# Patient Record
Sex: Female | Born: 1965 | Hispanic: Yes | Marital: Married | Smoking: Never smoker
Health system: Southern US, Community
[De-identification: ages and names within clinical notes are randomized; demographics above are authoritative.]

## PROBLEM LIST (undated history)

## (undated) DIAGNOSIS — E079 Disorder of thyroid, unspecified: Secondary | ICD-10-CM

## (undated) HISTORY — PX: EYE SURGERY: SHX253

## (undated) HISTORY — PX: ABDOMINAL HYSTERECTOMY: SHX81

---

## 2001-05-21 HISTORY — PX: ABDOMINAL HYSTERECTOMY: SHX81

## 2003-05-22 HISTORY — PX: OTHER SURGICAL HISTORY: SHX169

## 2003-05-22 HISTORY — PX: OPTIC NERVE DECOMPRESSION: SHX365

## 2003-10-03 ENCOUNTER — Emergency Department (HOSPITAL_COMMUNITY): Admission: EM | Admit: 2003-10-03 | Discharge: 2003-10-04 | Payer: Self-pay | Admitting: Emergency Medicine

## 2004-01-21 ENCOUNTER — Ambulatory Visit (HOSPITAL_COMMUNITY): Admission: RE | Admit: 2004-01-21 | Discharge: 2004-01-21 | Payer: Self-pay

## 2004-03-01 ENCOUNTER — Ambulatory Visit (HOSPITAL_COMMUNITY): Admission: RE | Admit: 2004-03-01 | Discharge: 2004-03-01 | Payer: Self-pay

## 2004-04-27 ENCOUNTER — Emergency Department (HOSPITAL_COMMUNITY): Admission: EM | Admit: 2004-04-27 | Discharge: 2004-04-27 | Payer: Self-pay | Admitting: Emergency Medicine

## 2004-05-21 HISTORY — PX: STRABISMUS SURGERY: SHX218

## 2004-12-15 ENCOUNTER — Encounter: Admission: RE | Admit: 2004-12-15 | Discharge: 2004-12-15 | Payer: Self-pay | Admitting: Obstetrics and Gynecology

## 2005-05-17 ENCOUNTER — Encounter: Admission: RE | Admit: 2005-05-17 | Discharge: 2005-05-17 | Payer: Self-pay | Admitting: Family Medicine

## 2005-12-17 ENCOUNTER — Encounter: Admission: RE | Admit: 2005-12-17 | Discharge: 2005-12-17 | Payer: Self-pay | Admitting: Obstetrics and Gynecology

## 2006-04-28 ENCOUNTER — Emergency Department (HOSPITAL_COMMUNITY): Admission: EM | Admit: 2006-04-28 | Discharge: 2006-04-28 | Payer: Self-pay | Admitting: Family Medicine

## 2006-07-06 ENCOUNTER — Emergency Department (HOSPITAL_COMMUNITY): Admission: EM | Admit: 2006-07-06 | Discharge: 2006-07-06 | Payer: Self-pay | Admitting: Family Medicine

## 2006-08-15 ENCOUNTER — Emergency Department (HOSPITAL_COMMUNITY): Admission: EM | Admit: 2006-08-15 | Discharge: 2006-08-15 | Payer: Self-pay | Admitting: Emergency Medicine

## 2006-09-28 ENCOUNTER — Emergency Department (HOSPITAL_COMMUNITY): Admission: EM | Admit: 2006-09-28 | Discharge: 2006-09-28 | Payer: Self-pay | Admitting: Emergency Medicine

## 2006-12-15 ENCOUNTER — Emergency Department (HOSPITAL_COMMUNITY): Admission: EM | Admit: 2006-12-15 | Discharge: 2006-12-15 | Payer: Self-pay | Admitting: Family Medicine

## 2006-12-19 ENCOUNTER — Encounter: Admission: RE | Admit: 2006-12-19 | Discharge: 2006-12-19 | Payer: Self-pay | Admitting: Family Medicine

## 2007-01-02 ENCOUNTER — Emergency Department (HOSPITAL_COMMUNITY): Admission: EM | Admit: 2007-01-02 | Discharge: 2007-01-02 | Payer: Self-pay | Admitting: Emergency Medicine

## 2007-10-08 ENCOUNTER — Emergency Department (HOSPITAL_COMMUNITY): Admission: EM | Admit: 2007-10-08 | Discharge: 2007-10-08 | Payer: Self-pay | Admitting: Emergency Medicine

## 2008-01-09 ENCOUNTER — Emergency Department (HOSPITAL_COMMUNITY): Admission: EM | Admit: 2008-01-09 | Discharge: 2008-01-09 | Payer: Self-pay | Admitting: Emergency Medicine

## 2008-02-07 ENCOUNTER — Emergency Department (HOSPITAL_COMMUNITY): Admission: EM | Admit: 2008-02-07 | Discharge: 2008-02-07 | Payer: Self-pay | Admitting: Family Medicine

## 2008-09-05 ENCOUNTER — Emergency Department (HOSPITAL_COMMUNITY): Admission: EM | Admit: 2008-09-05 | Discharge: 2008-09-05 | Payer: Self-pay | Admitting: Emergency Medicine

## 2009-06-02 ENCOUNTER — Emergency Department (HOSPITAL_COMMUNITY): Admission: EM | Admit: 2009-06-02 | Discharge: 2009-06-02 | Payer: Self-pay | Admitting: Family Medicine

## 2010-04-12 ENCOUNTER — Ambulatory Visit: Payer: Self-pay | Admitting: Family Medicine

## 2010-04-12 DIAGNOSIS — J0100 Acute maxillary sinusitis, unspecified: Secondary | ICD-10-CM | POA: Insufficient documentation

## 2010-04-12 DIAGNOSIS — E039 Hypothyroidism, unspecified: Secondary | ICD-10-CM | POA: Insufficient documentation

## 2010-04-12 DIAGNOSIS — J069 Acute upper respiratory infection, unspecified: Secondary | ICD-10-CM | POA: Insufficient documentation

## 2010-04-15 ENCOUNTER — Telehealth (INDEPENDENT_AMBULATORY_CARE_PROVIDER_SITE_OTHER): Payer: Self-pay

## 2010-05-21 HISTORY — PX: BUNIONECTOMY: SHX129

## 2010-06-11 ENCOUNTER — Encounter: Payer: Self-pay | Admitting: Ophthalmology

## 2010-06-20 NOTE — Progress Notes (Signed)
Summary: Courtesy call  Phone Note Outgoing Call   Call placed by: Areta Haber CMA,  April 15, 2010 12:28 PM Call placed to: Patient Summary of Call: Courtesy call to pt - Pt states she's feeling much better. Initial call taken by: Areta Haber CMA,  April 15, 2010 12:29 PM

## 2010-06-20 NOTE — Assessment & Plan Note (Signed)
Summary: CONGESTION,COUGH,HEADACHE/TJ (rm 4)   Vital Signs:  Patient Profile:   45 Years Old Female CC:      dry cough, sinus pressure, HA x 1 week Height:     55 inches Weight:      179 pounds O2 Sat:      98 % O2 treatment:    Room Air Temp:     97.5 degrees F oral Pulse rate:   62 / minute Resp:     14 per minute BP sitting:   105 / 69  (left arm) Cuff size:   regular  Pt. in pain?   yes    Location:   head    Intensity:   4    Type:       aching  Vitals Entered By: Lajean Saver RN (April 12, 2010 4:51 PM)                   Updated Prior Medication List: SYNTHROID 100 MCG TABS (LEVOTHYROXINE SODIUM)  ESTRADIOL 2 MG TABS (ESTRADIOL)  ALKA-SELTZER PLUS COLD 2-7.8-325 MG TBEF (CHLORPHEN-PHENYLEPH-ASA)   Current Allergies: No known allergies History of Present Illness Chief Complaint: dry cough, sinus pressure, HA x 1 week History of Present Illness:  Subjective: Patient complains of URI symptoms that started one week ago with a sore throat and nasal congestion, followed by a cough.  The cough is worse at night.   No pleuritic pain No wheezing + post-nasal drainage + left sinus pain/pressure for 2 days No itchy/red eyes + left earache No hemoptysis No SOB No fever; + chills started yesterday No nausea No vomiting No abdominal pain No diarrhea No skin rashes + fatigue No myalgias + headache Used OTC meds without relief   REVIEW OF SYSTEMS Constitutional Symptoms       Complains of fatigue.     Denies fever, chills, night sweats, weight loss, and weight gain.  Eyes       Complains of eye pain.      Denies change in vision, eye discharge, glasses, contact lenses, and eye surgery.      Comments: eye swelling in AM Ear/Nose/Throat/Mouth       Complains of sinus problems.      Denies hearing loss/aids, change in hearing, ear pain, ear discharge, dizziness, frequent runny nose, frequent nose bleeds, sore throat, hoarseness, and tooth pain or bleeding.    Respiratory       Complains of dry cough.      Denies productive cough, wheezing, shortness of breath, asthma, bronchitis, and emphysema/COPD.  Cardiovascular       Denies murmurs, chest pain, and tires easily with exhertion.    Gastrointestinal       Denies stomach pain, nausea/vomiting, diarrhea, constipation, blood in bowel movements, and indigestion. Genitourniary       Denies painful urination, kidney stones, and loss of urinary control. Neurological       Complains of headaches.      Denies paralysis, seizures, and fainting/blackouts. Musculoskeletal       Denies muscle pain, joint pain, joint stiffness, decreased range of motion, redness, swelling, muscle weakness, and gout.  Skin       Denies bruising, unusual mles/lumps or sores, and hair/skin or nail changes.  Psych       Denies mood changes, temper/anger issues, anxiety/stress, speech problems, depression, and sleep problems. Other Comments: Patient has had symptoms x 1 week. She has taken Alkaseltzer OTC without relief. No fever   Past History:  Past Medical History: Hypothyroidism  Past Surgical History: Hysterectomy  Family History: NA  Social History: Married Never Smoked Alcohol use-no Drug use-no Smoking Status:  never Drug Use:  no   Objective:  Appearance:  Patient appears healthy, stated age, and in no acute distress  Eyes:  Pupils are equal, round, and reactive to light and accomdation.  Extraocular movement is intact.  Conjunctivae are not inflamed.  Ears:  Canals normal.  Tympanic membranes normal.   Nose:  Normal septum.  Normal turbinates, mildly congested.  Maxillary and frontal sinus tenderness present.  Pharynx:  Normal  Neck:  Supple.  No adenopathy is present.  No thyromegaly is present  Lungs:  Clear to auscultation.  Breath sounds are equal.  Heart:  Regular rate and rhythm without murmurs, rubs, or gallops.  Abdomen:  Nontender without masses or hepatosplenomegaly.  Bowel sounds are  present.  No CVA or flank tenderness.  Extremities:  No edema.   Assessment New Problems: ACUTE MAXILLARY SINUSITIS (ICD-461.0) UPPER RESPIRATORY INFECTION (ICD-465.9) HYPOTHYROIDISM (ICD-244.9)   Plan New Medications/Changes: BENZONATATE 200 MG CAPS (BENZONATATE) One by mouth hs as needed cough  #12 x 0, 04/12/2010, Donna Christen MD AZITHROMYCIN 250 MG TABS (AZITHROMYCIN) Two tabs by mouth on day 1, then 1 tab daily on days 2 through 5  #6 tabs x 0, 04/12/2010, Donna Christen MD  New Orders: New Patient Level III [phone] Planning Comments:   Begin Z-pack, expectorant/decongestant, cough suppressant at bedtime.  Increase fluid intake Followup with PCP if not improving one week.   The patient and/or caregiver has been counseled thoroughly with regard to medications prescribed including dosage, schedule, interactions, rationale for use, and possible side effects and they verbalize understanding.  Diagnoses and expected course of recovery discussed and will return if not improved as expected or if the condition worsens. Patient and/or caregiver verbalized understanding.  Prescriptions: BENZONATATE 200 MG CAPS (BENZONATATE) One by mouth hs as needed cough  #12 x 0   Entered and Authorized by:   Donna Christen MD   Signed by:   Donna Christen MD on 04/12/2010   Method used:   Print then Give to Patient   RxID:   [id number] AZITHROMYCIN 250 MG TABS (AZITHROMYCIN) Two tabs by mouth on day 1, then 1 tab daily on days 2 through 5  #6 tabs x 0   Entered and Authorized by:   Donna Christen MD   Signed by:   Donna Christen MD on 04/12/2010   Method used:   Print then Give to Patient   RxID:   [id number]   Patient Instructions: 1)  Take Mucinex D (guaifenesin with decongestant) twice daily for congestion. 2)  Increase fluid intake, rest. 3)  May use Afrin nasal spray (or generic oxymetazoline) twice daily for about 5 days.  Also recommend using saline nasal spray several times daily  and/or saline nasal irrigation. 4)  Followup with family doctor if not improving one week.   Orders Added: 1)  New Patient Level III [29528]

## 2010-08-06 LAB — POCT URINALYSIS DIP (DEVICE)
Bilirubin Urine: NEGATIVE
Glucose, UA: NEGATIVE mg/dL
Ketones, ur: NEGATIVE mg/dL
Nitrite: POSITIVE — AB
Protein, ur: 30 mg/dL — AB
Specific Gravity, Urine: 1.02 (ref 1.005–1.030)
Urobilinogen, UA: 0.2 mg/dL (ref 0.0–1.0)
pH: 7 (ref 5.0–8.0)

## 2010-10-02 ENCOUNTER — Other Ambulatory Visit: Payer: Self-pay | Admitting: Obstetrics & Gynecology

## 2010-10-02 DIAGNOSIS — Z1231 Encounter for screening mammogram for malignant neoplasm of breast: Secondary | ICD-10-CM

## 2010-10-06 ENCOUNTER — Ambulatory Visit
Admission: RE | Admit: 2010-10-06 | Discharge: 2010-10-06 | Disposition: A | Discharge status: Home / Self Care | Payer: BC Managed Care – PPO | Source: Ambulatory Visit | Attending: Obstetrics & Gynecology | Admitting: Obstetrics & Gynecology

## 2010-10-06 DIAGNOSIS — Z1231 Encounter for screening mammogram for malignant neoplasm of breast: Secondary | ICD-10-CM

## 2010-12-22 ENCOUNTER — Other Ambulatory Visit: Payer: Self-pay | Admitting: Internal Medicine

## 2010-12-22 DIAGNOSIS — Z1231 Encounter for screening mammogram for malignant neoplasm of breast: Secondary | ICD-10-CM

## 2010-12-28 ENCOUNTER — Ambulatory Visit: Payer: BC Managed Care – PPO

## 2011-02-19 LAB — POCT RAPID STREP A: Streptococcus, Group A Screen (Direct): NEGATIVE

## 2011-07-01 ENCOUNTER — Emergency Department
Admission: EM | Admit: 2011-07-01 | Discharge: 2011-07-01 | Disposition: A | Discharge status: Home / Self Care | Payer: Self-pay | Source: Home / Self Care | Attending: Emergency Medicine | Admitting: Emergency Medicine

## 2011-07-01 DIAGNOSIS — J069 Acute upper respiratory infection, unspecified: Secondary | ICD-10-CM

## 2011-07-01 HISTORY — DX: Disorder of thyroid, unspecified: E07.9

## 2011-07-01 MED ORDER — AMOXICILLIN 875 MG PO TABS: 875.0000 mg | ORAL_TABLET | Freq: Two times a day (BID) | ORAL | Status: AC

## 2011-07-01 NOTE — ED Provider Notes (Signed)
History     CSN: [id number]  Arrival date & time 07/01/11  1116   First MD Initiated Contact with Patient 07/01/11 1122      No chief complaint on file.   (Consider location/radiation/quality/duration/timing/severity/associated sxs/prior treatment) HPI Tara Preston is a 46 y.o. female who complains of onset of cold symptoms for 7 days. Worsening last 2 days. No sore throat + cough No pleuritic pain No wheezing + nasal congestion + post-nasal drainage + sinus pain/pressure No chest congestion No itchy/red eyes No earache No hemoptysis No SOB + chills/sweats No fever No nausea No vomiting No abdominal pain No diarrhea No skin rashes No fatigue No myalgias + headache    No past medical history on file.  No past surgical history on file.  No family history on file.  History  Substance Use Topics  . Smoking status: Not on file  . Smokeless tobacco: Not on file  . Alcohol Use: Not on file    OB History    No data available      Review of Systems  Allergies  Review of patient's allergies indicates not on file.  Home Medications  No current outpatient prescriptions on file.  There were no vitals taken for this visit.  Physical Exam  Nursing note and vitals reviewed. Constitutional: She is oriented to person, place, and time. She appears well-developed and well-nourished.  HENT:  Head: Normocephalic and atraumatic.  Right Ear: Tympanic membrane, external ear and ear canal normal.  Left Ear: Tympanic membrane, external ear and ear canal normal.  Nose: Mucosal edema and rhinorrhea present.  Mouth/Throat: Posterior oropharyngeal erythema present. No oropharyngeal exudate or posterior oropharyngeal edema.  Eyes: No scleral icterus.  Neck: Neck supple.  Cardiovascular: Regular rhythm and normal heart sounds.   Pulmonary/Chest: Effort normal and breath sounds normal. No respiratory distress.  Neurological: She is alert and oriented to person, place, and  time.  Skin: Skin is warm and dry.  Psychiatric: She has a normal mood and affect. Her speech is normal.    ED Course  Procedures (including critical care time)  Labs Reviewed - No data to display No results found.   No diagnosis found.    MDM  1)  Take the prescribed antibiotic as instructed.  2)  Use nasal saline solution (over the counter) at least 3 times a day. 3)  Use over the counter decongestants like Zyrtec-D every 12 hours as needed to help with congestion.  If you have hypertension, do not take medicines with sudafed.  4)  Can take tylenol every 6 hours or motrin every 8 hours for pain or fever. 5)  Follow up with your primary doctor if no improvement in 5-7 days, sooner if increasing pain, fever, or new symptoms.     Lily Kocher, MD 07/01/11 [phone]

## 2011-07-01 NOTE — ED Notes (Signed)
Pt c/o nasal congestion and productive cough with green sputum onset Wednesday.  Denies fever.

## 2011-07-02 ENCOUNTER — Telehealth: Payer: Self-pay | Admitting: *Deleted

## 2011-07-02 NOTE — Telephone Encounter (Signed)
Message copied by Tami Ribas on Mon Jul 02, 2011  7:07 PM ------      Message from: Donna Christen A      Created: Mon Jul 02, 2011  6:32 PM       Please call in Z-pack, #1, no refill, take as directed.  Stop amoxicillin.      Thanks.

## 2011-08-12 ENCOUNTER — Emergency Department
Admission: EM | Admit: 2011-08-12 | Discharge: 2011-08-12 | Disposition: A | Discharge status: Home / Self Care | Payer: BC Managed Care – PPO | Source: Home / Self Care | Attending: Emergency Medicine | Admitting: Emergency Medicine

## 2011-08-12 DIAGNOSIS — R059 Cough, unspecified: Secondary | ICD-10-CM

## 2011-08-12 DIAGNOSIS — R05 Cough: Secondary | ICD-10-CM

## 2011-08-12 DIAGNOSIS — J069 Acute upper respiratory infection, unspecified: Secondary | ICD-10-CM

## 2011-08-12 MED ORDER — AZITHROMYCIN 250 MG PO TABS: ORAL_TABLET | ORAL | Status: AC

## 2011-08-12 NOTE — ED Provider Notes (Signed)
History     CSN: [id number]  Arrival date & time 08/12/11  1308   First MD Initiated Contact with Patient 08/12/11 1314      Chief Complaint  Patient presents with  . Cough    (Consider location/radiation/quality/duration/timing/severity/associated sxs/prior treatment) HPI Tara Preston is a 46 y.o. female who complains of onset of cold symptoms for a few days.  The symptoms are constant and mild-moderate in severity.  Not using anything OTC.  Had similar symptoms last month treated with Zpak successfully.  This time is worse b/c symptoms unilateral L side. + sore throat + cough No pleuritic pain No wheezing +nasal congestion + post-nasal drainage + sinus pain/pressure No chest congestion No itchy/red eyes + earache No hemoptysis No SOB No chills/sweats No fever No nausea No vomiting No abdominal pain No diarrhea No skin rashes No fatigue No myalgias + headache    Past Medical History  Diagnosis Date  . Thyroid disease     Past Surgical History  Procedure Date  . Abdominal hysterectomy     History reviewed. No pertinent family history.  History  Substance Use Topics  . Smoking status: Not on file  . Smokeless tobacco: Not on file  . Alcohol Use: No    OB History    Grav Para Term Preterm Abortions TAB SAB Ect Mult Living                  Review of Systems  All other systems reviewed and are negative.    Allergies  Amoxicillin  Home Medications   Current Outpatient Rx  Name Route Sig Dispense Refill  . AZITHROMYCIN 250 MG PO TABS  Use as directed 1 each 0  . ESTRADIOL 1 MG PO TABS Oral Take 1 mg by mouth daily.    Marland Kitchen LEVOTHYROXINE SODIUM 100 MCG PO TABS Oral Take 100 mcg by mouth daily.      BP 109/74  Pulse 62  Temp(Src) 98 F (36.7 C) (Oral)  Resp 20  Ht 5\' 5"  (1.651 m)  Wt 191 lb (86.637 kg)  BMI 31.78 kg/m2  SpO2 95%  Physical Exam  Nursing note and vitals reviewed. Constitutional: She is oriented to person, place, and time.  She appears well-developed and well-nourished.  HENT:  Head: Normocephalic and atraumatic.  Right Ear: Tympanic membrane, external ear and ear canal normal.  Left Ear: Tympanic membrane, external ear and ear canal normal.  Nose: Mucosal edema and rhinorrhea present. Left sinus exhibits maxillary sinus tenderness.  Mouth/Throat: Posterior oropharyngeal erythema present. No oropharyngeal exudate or posterior oropharyngeal edema.  Eyes: No scleral icterus.  Neck: Neck supple.  Cardiovascular: Regular rhythm and normal heart sounds.   Pulmonary/Chest: Effort normal and breath sounds normal. No respiratory distress. She has no decreased breath sounds. She has no wheezes. She has no rhonchi.  Neurological: She is alert and oriented to person, place, and time.  Skin: Skin is warm and dry.  Psychiatric: She has a normal mood and affect. Her speech is normal.    ED Course  Procedures (including critical care time)  Labs Reviewed - No data to display No results found.   1. Acute upper respiratory infections of unspecified site   2. Cough       MDM  1)  Take the prescribed antibiotic as instructed. 2)  Use nasal saline solution (over the counter) at least 3 times a day. 3)  Use over the counter decongestants like Zyrtec-D every 12 hours as needed to help with  congestion.  If you have hypertension, do not take medicines with sudafed.  4)  Can take tylenol every 6 hours or motrin every 8 hours for pain or fever. 5)  Follow up with your primary doctor if no improvement in 5-7 days, sooner if increasing pain, fever, or new symptoms.    If recurrent symptoms, consider Flonase.      Marlaine Hind, MD 08/12/11 1343

## 2011-08-12 NOTE — ED Notes (Signed)
Seen here 3 weeks ago, given ABT for sinus infection, new symptoms started on Thursday with facial pressure, congestion and productive cough

## 2012-09-16 ENCOUNTER — Encounter: Payer: Self-pay | Admitting: *Deleted

## 2012-09-16 ENCOUNTER — Emergency Department
Admission: EM | Admit: 2012-09-16 | Discharge: 2012-09-16 | Disposition: A | Discharge status: Home / Self Care | Payer: BC Managed Care – PPO | Source: Home / Self Care | Attending: Family Medicine | Admitting: Family Medicine

## 2012-09-16 DIAGNOSIS — J069 Acute upper respiratory infection, unspecified: Secondary | ICD-10-CM

## 2012-09-16 DIAGNOSIS — J309 Allergic rhinitis, unspecified: Secondary | ICD-10-CM

## 2012-09-16 DIAGNOSIS — R062 Wheezing: Secondary | ICD-10-CM

## 2012-09-16 MED ORDER — AZITHROMYCIN 250 MG PO TABS: ORAL_TABLET | ORAL | Status: DC

## 2012-09-16 MED ORDER — CETIRIZINE HCL 10 MG PO CAPS: 10.0000 mg | ORAL_CAPSULE | Freq: Every day | ORAL | Status: DC

## 2012-09-16 MED ORDER — METHYLPREDNISOLONE ACETATE 80 MG/ML IJ SUSP
80.0000 mg | Freq: Once | INTRAMUSCULAR | Status: AC
  Administered 2012-09-16: 80 mg via INTRAMUSCULAR

## 2012-09-16 NOTE — ED Notes (Signed)
Pt c/o productive cough, SOB, and nasal congestion x 3 days. Denies fever. She has taken Sudafed and Zyrtec.

## 2012-09-16 NOTE — ED Provider Notes (Signed)
History     CSN: [id number]  Arrival date & time 09/16/12  1801   First MD Initiated Contact with Patient 09/16/12 1805      Chief Complaint  Patient presents with  . Cough   HPI  URI Symptoms Onset: 4-5 days  Description: rhinorrhea, nasal congestion, SOB, wheezing  Modifying factors:  None   Symptoms Nasal discharge: yes Fever: no Sore throat: no Cough: yes Wheezing: yes Ear pain: no GI symptoms: no Sick contacts: no  Red Flags  Stiff neck: no Dyspnea: yes Rash: no Swallowing difficulty: no  Sinusitis Risk Factors Headache/face pain: no Double sickening: no tooth pain: no  Allergy Risk Factors Sneezing: yes Itchy scratchy throat: yes Seasonal symptoms: no  Flu Risk Factors Headache: no muscle aches: no severe fatigue: no   Past Medical History  Diagnosis Date  . Thyroid disease     Past Surgical History  Procedure Laterality Date  . Abdominal hysterectomy      History reviewed. No pertinent family history.  History  Substance Use Topics  . Smoking status: Not on file  . Smokeless tobacco: Not on file  . Alcohol Use: No    OB History   Grav Para Term Preterm Abortions TAB SAB Ect Mult Living                  Review of Systems  All other systems reviewed and are negative.    Allergies  Amoxicillin  Home Medications   Current Outpatient Rx  Name  Route  Sig  Dispense  Refill  . azithromycin (ZITHROMAX) 250 MG tablet      Take 2 tabs PO x 1 dose, then 1 tab PO QD x 4 days   6 tablet   0   . Cetirizine HCl 10 MG CAPS   Oral   Take 1 capsule (10 mg total) by mouth daily.   30 capsule   3   . estradiol (ESTRACE) 1 MG tablet   Oral   Take 1 mg by mouth daily.         Marland Kitchen levothyroxine (SYNTHROID, LEVOTHROID) 100 MCG tablet   Oral   Take 100 mcg by mouth daily.           BP 124/83  Pulse 77  Temp(Src) 97.7 F (36.5 C) (Oral)  Resp 18  Ht 5\' 5"  (1.651 m)  Wt 189 lb (85.73 kg)  BMI 31.45 kg/m2  SpO2  97%  Physical Exam  Constitutional: She appears well-developed and well-nourished.  HENT:  Head: Normocephalic and atraumatic.  Right Ear: External ear normal.  Left Ear: External ear normal.  +nasal erythema, rhinorrhea bilaterally, + post oropharyngeal erythema    Eyes: Conjunctivae are normal. Pupils are equal, round, and reactive to light.  Neck: Normal range of motion. Neck supple.  Cardiovascular: Normal rate and regular rhythm.   Pulmonary/Chest: Effort normal and breath sounds normal.  Faint wheezes    Abdominal: Soft.  Musculoskeletal: Normal range of motion.  Neurological: She is alert.  Skin: Skin is warm.    ED Course  Procedures (including critical care time)  Labs Reviewed - No data to display No results found.   1. URI (upper respiratory infection)   2. Allergic rhinitis   3. Wheezing       MDM  Depomedrol 80mg  IMx1 for wheezing  zpak for atypical coverage  Zyrtec for allergies Discussed general and resp red flags.  Follow up as needed.     The patient and/or  caregiver has been counseled thoroughly with regard to treatment plan and/or medications prescribed including dosage, schedule, interactions, rationale for use, and possible side effects and they verbalize understanding. Diagnoses and expected course of recovery discussed and will return if not improved as expected or if the condition worsens. Patient and/or caregiver verbalized understanding.             Doree Albee, MD 09/16/12 [phone]

## 2014-02-28 ENCOUNTER — Encounter: Payer: Self-pay | Admitting: Emergency Medicine

## 2014-02-28 ENCOUNTER — Emergency Department
Admission: EM | Admit: 2014-02-28 | Discharge: 2014-02-28 | Disposition: A | Discharge status: Home / Self Care | Payer: BC Managed Care – PPO | Source: Home / Self Care | Attending: Emergency Medicine | Admitting: Emergency Medicine

## 2014-02-28 DIAGNOSIS — J209 Acute bronchitis, unspecified: Secondary | ICD-10-CM

## 2014-02-28 MED ORDER — PROMETHAZINE-CODEINE 6.25-10 MG/5ML PO SYRP: ORAL_SOLUTION | ORAL | Status: DC

## 2014-02-28 MED ORDER — AZITHROMYCIN 250 MG PO TABS: ORAL_TABLET | ORAL | Status: DC

## 2014-02-28 NOTE — ED Notes (Signed)
Sinus congestion onset greater than one week.

## 2014-03-04 NOTE — ED Provider Notes (Signed)
CSN: [id number]     Arrival date & time 02/28/14  1116 History   First MD Initiated Contact with Patient 02/28/14 1125     Chief Complaint  Patient presents with  . Facial Pain   (Consider location/radiation/quality/duration/timing/severity/associated sxs/prior Treatment) HPI SINUSITIS  Onset: 8 days--progressively worsening Facial/sinus pressure with discolored nasal mucus.    Severity: moderate Tried OTC meds without significant relief.  Symptoms:  + Fever  + URI prodrome with nasal congestion + Minimal swollen neck glands + mild Sinus Headache + mild ear pressure  No Allergy symptoms No significant Sore Throat No eye symptoms     Mild cough, occasional sputum No chest pain No shortness of breath  No wheezing  No Abdominal Pain No Nausea No Vomiting No diarrhea  No Myalgias No focal neurologic symptoms No syncope No Rash  No Urinary symptoms          Past Medical History  Diagnosis Date  . Thyroid disease    Past Surgical History  Procedure Laterality Date  . Abdominal hysterectomy     Family History  Problem Relation Age of Onset  . Diabetes Father    History  Substance Use Topics  . Smoking status: Never Smoker   . Smokeless tobacco: Never Used  . Alcohol Use: No   OB History   Grav Para Term Preterm Abortions TAB SAB Ect Mult Living                 Review of Systems  All other systems reviewed and are negative.   Allergies  Amoxicillin  Home Medications   Prior to Admission medications   Medication Sig Start Date End Date Taking? Authorizing Provider  azithromycin (ZITHROMAX Z-PAK) 250 MG tablet Take 2 tablets on day one, then 1 tablet daily on days 2 through 5 02/28/14   Lajean Manesavid Laquanna Veazey, MD  estradiol (ESTRACE) 1 MG tablet Take 1 mg by mouth daily.    Historical Provider, MD  levothyroxine (SYNTHROID, LEVOTHROID) 100 MCG tablet Take 100 mcg by mouth daily.    Historical Provider, MD  promethazine-codeine (PHENERGAN WITH  CODEINE) 6.25-10 MG/5ML syrup Take 1-2 teaspoons every 4-6 hours as needed for cough. May cause drowsiness. 02/28/14   Lajean Manesavid Electa Sterry, MD   BP 117/76  Pulse 73  Temp(Src) 98 F (36.7 C) (Oral)  Resp 18  Ht 5\' 5"  (1.651 m)  Wt 189 lb (85.73 kg)  BMI 31.45 kg/m2  SpO2 96% Physical Exam  Nursing note and vitals reviewed. Constitutional: She is oriented to person, place, and time. She appears well-developed and well-nourished. No distress.  HENT:  Head: Normocephalic and atraumatic.  Right Ear: Tympanic membrane, external ear and ear canal normal.  Left Ear: Tympanic membrane, external ear and ear canal normal.  Nose: Mucosal edema and rhinorrhea present. Right sinus exhibits maxillary sinus tenderness. Left sinus exhibits maxillary sinus tenderness.  Mouth/Throat: Oropharynx is clear and moist. No oral lesions. No oropharyngeal exudate.  Eyes: Right eye exhibits no discharge. Left eye exhibits no discharge. No scleral icterus.  Neck: Neck supple.  Cardiovascular: Normal rate, regular rhythm and normal heart sounds.   Pulmonary/Chest: Effort normal and breath sounds normal. She has no wheezes. She has no rales.  Lymphadenopathy:    She has no cervical adenopathy.  Neurological: She is alert and oriented to person, place, and time.  Skin: Skin is warm and dry.    ED Course  Procedures (including critical care time) Labs Review Labs Reviewed - No data to display  Imaging Review No results found.   MDM   1. Acute bronchitis, unspecified organism    and acute maxillary sinusitis Treatment options discussed, as well as risks, benefits, alternatives. Patient voiced understanding and agreement with the following plans: Z-Pak Phenergan With Codeine if needed for cough at bedtime. Precautions discussed Other symptomatic care discussed Follow-up with your primary care doctor in 5-7 days if not improving, or sooner if symptoms become worse. Precautions discussed. Red flags  discussed. Questions invited and answered. Patient voiced understanding and agreement.     Lajean Manesavid Muhamed Luecke, MD 03/04/14 [phone]

## 2016-05-21 HISTORY — PX: BUNIONECTOMY: SHX129

## 2016-11-01 ENCOUNTER — Encounter: Payer: Self-pay | Admitting: Family Medicine

## 2017-11-04 ENCOUNTER — Ambulatory Visit (INDEPENDENT_AMBULATORY_CARE_PROVIDER_SITE_OTHER): Payer: BC Managed Care – PPO

## 2017-11-04 ENCOUNTER — Other Ambulatory Visit: Payer: Self-pay | Admitting: Podiatry

## 2017-11-04 ENCOUNTER — Encounter: Payer: Self-pay | Admitting: Podiatry

## 2017-11-04 ENCOUNTER — Ambulatory Visit: Payer: BC Managed Care – PPO | Admitting: Podiatry

## 2017-11-04 DIAGNOSIS — M79671 Pain in right foot: Secondary | ICD-10-CM

## 2017-11-04 DIAGNOSIS — M21619 Bunion of unspecified foot: Secondary | ICD-10-CM

## 2017-11-04 DIAGNOSIS — M2041 Other hammer toe(s) (acquired), right foot: Secondary | ICD-10-CM

## 2017-11-04 DIAGNOSIS — M2010 Hallux valgus (acquired), unspecified foot: Secondary | ICD-10-CM

## 2017-11-04 DIAGNOSIS — M79672 Pain in left foot: Secondary | ICD-10-CM | POA: Diagnosis not present

## 2017-11-04 NOTE — Progress Notes (Signed)
Subjective:   Patient ID: Tara Preston, female   DOB: 52 y.o.   MRN: [id number]   HPI Patient presents with caregiver with chronic structural bunion deformity right which is gotten worse over the last year pain underneath the right foot of the more recent duration with a lifting of the second toe.  We had fixed her bunion on the left foot a number of years ago and she is doing extremely well along with fixing her second toe and she is interested in getting the right foot fixed.  Patient does not smoke and likes to be active   Review of Systems  All other systems reviewed and are negative.       Objective:  Physical Exam  Constitutional: She appears well-developed and well-nourished.  Cardiovascular: Intact distal pulses.  Pulmonary/Chest: Effort normal.  Musculoskeletal: Normal range of motion.  Neurological: She is alert.  Skin: Skin is warm.  Nursing note and vitals reviewed.   Neurovascular status found to be intact muscle strength is adequate range of motion within normal limits with patient found to have a large hyperostosis with structural deformity first metatarsal right with redness around the head and elevation of the second digit with inflammation of the inner phalangeal joint metatarsal phalangeal joint present.  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  I recommended structural bunion correction right along with digital fusion explained the procedures and risk and patient wants surgery.  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  At this point I did allow her to go over consent form reviewing  alternative treatments and complications and she wants bunion correction with digital fusion and I explained the procedures and the fact total recovery will take 6 months to 1 year and I dispensed air fracture walker that I want her to get used to today.  Patient is scheduled for surgery in the next several days and was given all preoperative questions and is encouraged to call with any questions which may come up  X-ray indicates that the patient has a elevation of the first intermetatarsal angle right foot and mild elevation second digit right with no indications of other pathology.  Left foot is healing very well with fixation in place and good reduction of the intermetatarsal angle

## 2017-11-05 ENCOUNTER — Encounter: Payer: Self-pay | Admitting: Podiatry

## 2017-11-05 ENCOUNTER — Telehealth: Payer: Self-pay | Admitting: Podiatry

## 2017-11-05 ENCOUNTER — Other Ambulatory Visit: Payer: Self-pay

## 2017-11-05 DIAGNOSIS — M2011 Hallux valgus (acquired), right foot: Secondary | ICD-10-CM

## 2017-11-05 DIAGNOSIS — M2041 Other hammer toe(s) (acquired), right foot: Secondary | ICD-10-CM

## 2017-11-05 NOTE — Telephone Encounter (Signed)
[facility] called about the Percoset that was presrcibed- (allergy to hydrocodone)- the mg is too high they want to know if they can change the time frame of the medication or the mg. Please contact pharmacy back at [phone], [facility] back. Please call patient and let her know that pharmacy was called. She stated that she needed medication before the pain starts

## 2017-11-05 NOTE — Progress Notes (Signed)
Changed Rx for Percocet to Q 8 hours, spoke with patient who stated she did ok taking percocet and did not have any type of allergic reaction to it

## 2017-11-05 NOTE — Telephone Encounter (Signed)
[facility] pharmacist states pt has picked up the rx, someone else called.

## 2017-11-05 NOTE — Telephone Encounter (Signed)
[facility] called about the Percoset that was presrcibed- (allergy to hydrocodone)- the mg is too high they want to know if they can change the time frame of the medication or the mg. Please contact pharmacy back at [phone]

## 2017-11-13 ENCOUNTER — Ambulatory Visit (INDEPENDENT_AMBULATORY_CARE_PROVIDER_SITE_OTHER): Payer: BC Managed Care – PPO

## 2017-11-13 ENCOUNTER — Encounter: Payer: Self-pay | Admitting: Podiatry

## 2017-11-13 ENCOUNTER — Ambulatory Visit (INDEPENDENT_AMBULATORY_CARE_PROVIDER_SITE_OTHER): Payer: BC Managed Care – PPO | Admitting: Podiatry

## 2017-11-13 VITALS — Temp 96.6°F

## 2017-11-13 DIAGNOSIS — M2041 Other hammer toe(s) (acquired), right foot: Secondary | ICD-10-CM

## 2017-11-13 DIAGNOSIS — M21619 Bunion of unspecified foot: Secondary | ICD-10-CM

## 2017-11-14 NOTE — Progress Notes (Signed)
Subjective:   Patient ID: Tara Preston, female   DOB: 52 y.o.   MRN: [id number]   HPI Patient states doing real well with occasional discomfort but overall walking well and is having minimal discomfort   ROS      Objective:  Physical Exam  Neurovascular status intact negative Homans sign was noted with patient's foot healing well wound edges well coapted hallux in rectus position pin in place second toe.  There is a slight over straightening of the right big toe but it does not appear to be significant     Assessment:  Doing well post osteotomies of the right foot with mild varus component right first hallux     Plan:  Reviewed condition and recommended continuation of conservative treatme and at this time discussed the varus component of the right first metatarsal and recommended over bandaging in a valgus position and watching it and I do not think long-term it will be a problem.  Sterile dressing reapplied continue immobilization elevation  X-ray indicates that the osteotomy is healing well with slight overcorrection and tibial peaking we will keep an eye on this during the next few weeks

## 2017-11-15 ENCOUNTER — Ambulatory Visit: Payer: BC Managed Care – PPO

## 2017-11-15 ENCOUNTER — Telehealth: Payer: Self-pay | Admitting: *Deleted

## 2017-11-15 NOTE — Telephone Encounter (Signed)
Pt states she needs to explain about her foot.

## 2017-11-15 NOTE — Telephone Encounter (Signed)
I spoke with pt she states she is having a lot of burning pain and would like to be seen, there is something going on with her toe after the dressing on Tuesday.

## 2017-11-27 ENCOUNTER — Ambulatory Visit (INDEPENDENT_AMBULATORY_CARE_PROVIDER_SITE_OTHER): Payer: BC Managed Care – PPO

## 2017-11-27 ENCOUNTER — Ambulatory Visit (INDEPENDENT_AMBULATORY_CARE_PROVIDER_SITE_OTHER): Payer: Self-pay | Admitting: Podiatry

## 2017-11-27 ENCOUNTER — Encounter: Payer: Self-pay | Admitting: Podiatry

## 2017-11-27 DIAGNOSIS — M2041 Other hammer toe(s) (acquired), right foot: Secondary | ICD-10-CM

## 2017-11-28 NOTE — Progress Notes (Signed)
Subjective:   Patient ID: Tara Preston, female   DOB: 52 y.o.   MRN: [id number]   HPI Patient states doing real well with her right foot with mild discomfort and overall feels like she is walking well   ROS      Objective:  Physical Exam  Neurovascular status intact negative Homans sign noted pin in place second digit right wound edges well coapted with good alignment and good position of the second toe overall     Assessment:  Doing well post foot surgery right foot with wound edges well coapted stitches intact in good alignment noted with fixation in proper position     Plan:  H&P x-rays reviewed with patient and at this point I went ahead and remove stitches wound edges well coapted instructed on continuing to keep the second toe in a plantarflexed position and in good structural alignment and went ahead and reappoint for pin removal or earlier if any issues should occur  X-rays indicate that the fixation is in place alignment good with good positional component noted

## 2017-12-13 ENCOUNTER — Encounter: Payer: Self-pay | Admitting: Podiatry

## 2017-12-13 ENCOUNTER — Ambulatory Visit: Payer: BC Managed Care – PPO | Admitting: Podiatry

## 2017-12-13 ENCOUNTER — Ambulatory Visit (INDEPENDENT_AMBULATORY_CARE_PROVIDER_SITE_OTHER): Payer: BC Managed Care – PPO

## 2017-12-13 ENCOUNTER — Telehealth: Payer: Self-pay | Admitting: Podiatry

## 2017-12-13 DIAGNOSIS — B351 Tinea unguium: Secondary | ICD-10-CM

## 2017-12-13 DIAGNOSIS — M21611 Bunion of right foot: Secondary | ICD-10-CM | POA: Diagnosis not present

## 2017-12-13 DIAGNOSIS — M21619 Bunion of unspecified foot: Secondary | ICD-10-CM

## 2017-12-13 LAB — HEPATIC FUNCTION PANEL
AG Ratio: 1.4 (calc) (ref 1.0–2.5)
ALT: 23 U/L (ref 6–29)
AST: 28 U/L (ref 10–35)
Albumin: 4.2 g/dL (ref 3.6–5.1)
Alkaline phosphatase (APISO): 93 U/L (ref 33–130)
Bilirubin, Direct: 0.1 mg/dL (ref 0.0–0.2)
Globulin: 2.9 g/dL (calc) (ref 1.9–3.7)
Indirect Bilirubin: 0.4 mg/dL (calc) (ref 0.2–1.2)
Total Bilirubin: 0.5 mg/dL (ref 0.2–1.2)
Total Protein: 7.1 g/dL (ref 6.1–8.1)

## 2017-12-13 MED ORDER — TERBINAFINE HCL 250 MG PO TABS: 250.0000 mg | ORAL_TABLET | Freq: Every day | ORAL | 0 refills | Status: DC

## 2017-12-13 NOTE — Addendum Note (Signed)
Addended by: Alphia Kava'CONNELL, Cathline Dowen D on: 12/13/2017 01:59 PM   Modules accepted: Orders

## 2017-12-13 NOTE — Telephone Encounter (Signed)
Pt said medication was sent to wrong Pharmacy. New Pharmacy is now in system, please resend medication.

## 2017-12-14 NOTE — Progress Notes (Signed)
Subjective:   Patient ID: Tara Preston, female   DOB: 52 y.o.   MRN: [id number]   HPI Patient states overall doing well with mild discomfort of my second toe and I want the fungus on my toenails treated   ROS      Objective:  Physical Exam  Neurovascular status intact with pin in place second digit right well-healed surgical site right first metatarsal second toe with patient noted to have very thickened nailbeds 1-5 both feet     Assessment:  Doing well from digital procedure second digit right and structural bunion correction with fungal nail infection bilateral     Plan:  Pin removed second digit right with sterile dressing applied discussed fungal infection we will start her on oral Lamisil 250 mg daily for 90 days and I did order liver function studies.  I then discussed gradual increase in activity levels dispensed ankle compression stocking and reappoint for me to recheck again in 4 weeks  X-ray indicates osteotomies healing well with good position second toe

## 2018-01-15 ENCOUNTER — Ambulatory Visit (INDEPENDENT_AMBULATORY_CARE_PROVIDER_SITE_OTHER): Payer: BC Managed Care – PPO | Admitting: Podiatry

## 2018-01-15 ENCOUNTER — Encounter: Payer: Self-pay | Admitting: Podiatry

## 2018-01-15 ENCOUNTER — Ambulatory Visit (INDEPENDENT_AMBULATORY_CARE_PROVIDER_SITE_OTHER): Payer: BC Managed Care – PPO

## 2018-01-15 DIAGNOSIS — M2041 Other hammer toe(s) (acquired), right foot: Secondary | ICD-10-CM

## 2018-01-15 DIAGNOSIS — M21619 Bunion of unspecified foot: Secondary | ICD-10-CM

## 2018-01-17 NOTE — Progress Notes (Signed)
Subjective:   Patient ID: Tara Preston, female   DOB: 52 y.o.   MRN: [id number]   HPI Patient states overall doing well with diminished discomfort and able to walk distances without pain   ROS      Objective:  Physical Exam  Neurovascular status intact with patient's right foot healing well after having [place] in digital procedure     Assessment:  Patient is doing well overall with good recovery from previous surgery     Plan:  Final x-ray reviewed discussed the importance of continue to keep the toe and over valgus position but no indication of varus deformity with joint congruous patient's discharge will be seen back as needed  X-ray indicates the osteotomy is healing well structural correction is good joint congruence

## 2018-04-16 ENCOUNTER — Encounter: Payer: Self-pay | Admitting: Podiatry

## 2018-04-16 ENCOUNTER — Ambulatory Visit: Payer: BC Managed Care – PPO | Admitting: Podiatry

## 2018-04-16 ENCOUNTER — Ambulatory Visit (INDEPENDENT_AMBULATORY_CARE_PROVIDER_SITE_OTHER): Payer: BC Managed Care – PPO

## 2018-04-16 DIAGNOSIS — B351 Tinea unguium: Secondary | ICD-10-CM

## 2018-04-16 DIAGNOSIS — M2041 Other hammer toe(s) (acquired), right foot: Secondary | ICD-10-CM

## 2018-04-16 NOTE — Progress Notes (Signed)
Subjective:   Patient ID: Tara Preston, female   DOB: 52 y.o.   MRN: [id number]   HPI Patient states she is very happy with her surgery but is also concerned about her nails and the thickness of the beds and whether or not she needs more medication   ROS      Objective:  Physical Exam  Neurovascular status intact digit in good alignment right hallux with excellent range of motion no crepitus with incision site is healed well and moderate thickness of the nailbeds but more due to the trauma to the beds versus fungus     Assessment:  Doing well post osteotomy right first metatarsal with mild fungal infiltration     Plan:  Reviewed fungal infiltration cannot recommend further treatments currently and patient's discharge from surgery with no restrictions  X-ray indicates osteotomies healing well fixation in place joint congruence

## 2018-12-24 LAB — LIPID PANEL
Cholesterol: 199 (ref 0–200)
HDL: 59 (ref 35–70)
LDL Cholesterol: 119
Triglycerides: 104 (ref 40–160)

## 2019-03-13 ENCOUNTER — Other Ambulatory Visit: Payer: Self-pay | Admitting: Family Medicine

## 2019-03-13 DIAGNOSIS — Z1231 Encounter for screening mammogram for malignant neoplasm of breast: Secondary | ICD-10-CM

## 2019-04-29 ENCOUNTER — Other Ambulatory Visit: Payer: Self-pay

## 2019-04-29 ENCOUNTER — Ambulatory Visit
Admission: RE | Admit: 2019-04-29 | Discharge: 2019-04-29 | Disposition: A | Discharge status: Home / Self Care | Payer: BC Managed Care – PPO | Source: Ambulatory Visit | Attending: Family Medicine | Admitting: Family Medicine

## 2019-04-29 DIAGNOSIS — Z1231 Encounter for screening mammogram for malignant neoplasm of breast: Secondary | ICD-10-CM

## 2019-07-29 LAB — HM COLONOSCOPY

## 2019-08-24 LAB — COMPREHENSIVE METABOLIC PANEL
Albumin: 4.3 (ref 3.5–5.0)
Calcium: 10.1 (ref 8.7–10.7)

## 2019-08-24 LAB — BASIC METABOLIC PANEL
BUN: 7 (ref 4–21)
CO2: 30 — AB (ref 13–22)
Chloride: 102 (ref 99–108)
Creatinine: 0.6 (ref 0.5–1.1)
Glucose: 104
Potassium: 4.2 (ref 3.4–5.3)
Sodium: 137 (ref 137–147)

## 2019-08-24 LAB — CBC AND DIFFERENTIAL
HCT: 41 (ref 36–46)
Hemoglobin: 14.1 (ref 12.0–16.0)
Platelets: 259 (ref 150–399)
WBC: 6.7

## 2019-08-24 LAB — TSH: TSH: 0.94 (ref 0.41–5.90)

## 2019-08-24 LAB — HEPATIC FUNCTION PANEL
ALT: 27 (ref 7–35)
AST: 18 (ref 13–35)
Alkaline Phosphatase: 83 (ref 25–125)
Bilirubin, Total: 0.4

## 2019-08-24 LAB — CBC: RBC: 4.34 (ref 3.87–5.11)

## 2020-06-01 ENCOUNTER — Other Ambulatory Visit: Payer: BC Managed Care – PPO

## 2020-06-01 ENCOUNTER — Other Ambulatory Visit: Payer: Self-pay

## 2020-06-01 DIAGNOSIS — Z20822 Contact with and (suspected) exposure to covid-19: Secondary | ICD-10-CM

## 2020-06-03 LAB — NOVEL CORONAVIRUS, NAA: SARS-CoV-2, NAA: DETECTED — AB

## 2020-06-03 LAB — SARS-COV-2, NAA 2 DAY TAT

## 2020-06-09 ENCOUNTER — Other Ambulatory Visit: Payer: Self-pay

## 2020-06-09 ENCOUNTER — Emergency Department
Admission: EM | Admit: 2020-06-09 | Discharge: 2020-06-09 | Disposition: A | Discharge status: Home / Self Care | Payer: BC Managed Care – PPO | Source: Home / Self Care

## 2020-06-09 DIAGNOSIS — J019 Acute sinusitis, unspecified: Secondary | ICD-10-CM | POA: Diagnosis not present

## 2020-06-09 DIAGNOSIS — B9689 Other specified bacterial agents as the cause of diseases classified elsewhere: Secondary | ICD-10-CM

## 2020-06-09 DIAGNOSIS — M549 Dorsalgia, unspecified: Secondary | ICD-10-CM

## 2020-06-09 DIAGNOSIS — U071 COVID-19: Secondary | ICD-10-CM

## 2020-06-09 DIAGNOSIS — M62838 Other muscle spasm: Secondary | ICD-10-CM

## 2020-06-09 MED ORDER — CYCLOBENZAPRINE HCL 5 MG PO TABS: 5.0000 mg | ORAL_TABLET | Freq: Three times a day (TID) | ORAL | 0 refills | Status: DC | PRN

## 2020-06-09 MED ORDER — BENZONATATE 100 MG PO CAPS: 100.0000 mg | ORAL_CAPSULE | Freq: Three times a day (TID) | ORAL | 0 refills | Status: DC

## 2020-06-09 MED ORDER — DOXYCYCLINE HYCLATE 100 MG PO CAPS: 100.0000 mg | ORAL_CAPSULE | Freq: Two times a day (BID) | ORAL | 0 refills | Status: AC

## 2020-06-09 NOTE — Discharge Instructions (Signed)

## 2020-06-09 NOTE — ED Provider Notes (Signed)
[name]    CSN: [id number] Arrival date & time: 06/09/20  1302      History   Chief Complaint Chief Complaint  Patient presents with  . Covid Positive    Monday 1/10  . Nasal Congestion  . Cough  . Headache    HPI Tara Preston is a 55 y.o. female.   HPI  Tara Preston is a 55 y.o. female presenting to UC with c/o cough and congestion and body aches on 05/30/20, a day or two after receiving her COVID booster.  She tested positive for COVID at home and a drive through testing site.  She is presenting today with c/o sinus pain and pressure and right upper back soreness that is worse with cough and movement. She has taken ibuprofen as needed, last dose was last night.  Denies fever, chills, n/v/d.   Past Medical History:  Diagnosis Date  . Thyroid disease     Patient Active Problem List   Diagnosis Date Noted  . HYPOTHYROIDISM 04/12/2010  . ACUTE MAXILLARY SINUSITIS 04/12/2010  . UPPER RESPIRATORY INFECTION 04/12/2010    Past Surgical History:  Procedure Laterality Date  . ABDOMINAL HYSTERECTOMY      OB History   No obstetric history on file.      Home Medications    Prior to Admission medications   Medication Sig Start Date End Date Taking? Authorizing Provider  benzonatate (TESSALON) 100 MG capsule Take 1 capsule (100 mg total) by mouth every 8 (eight) hours. 06/09/20  Yes Tammra Pressman O, PA-C  cyclobenzaprine (FLEXERIL) 5 MG tablet Take 1 tablet (5 mg total) by mouth 3 (three) times daily as needed for muscle spasms. 06/09/20  Yes Lailoni Baquera O, PA-C  doxycycline (VIBRAMYCIN) 100 MG capsule Take 1 capsule (100 mg total) by mouth 2 (two) times daily for 7 days. 06/09/20 06/16/20 Yes Namrata Dangler, Vangie Bicker, PA-C  azithromycin (ZITHROMAX Z-PAK) 250 MG tablet Take 2 tablets on day one, then 1 tablet daily on days 2 through 5 02/28/14   Lajean Manes, MD  conjugated estrogens (PREMARIN) vaginal cream  09/14/13   [provider]  estradiol (CLIMARA -  DOSED IN MG/24 HR) 0.1 mg/24hr patch  11/01/17   [provider]  estradiol (ESTRACE) 1 MG tablet Take 1 mg by mouth daily.    [provider]  levothyroxine (SYNTHROID, LEVOTHROID) 100 MCG tablet Take 100 mcg by mouth daily.    [provider]  levothyroxine (SYNTHROID, LEVOTHROID) 112 MCG tablet  10/01/17   [provider]  LORazepam (ATIVAN) 1 MG tablet  09/04/17   [provider]  ondansetron (ZOFRAN) 4 MG tablet Take 4 mg by mouth every 8 (eight) hours as needed. for nausea 11/05/17   [provider]  oxyCODONE-acetaminophen (PERCOCET) 10-325 MG tablet Take 1 tablet by mouth every 8 (eight) hours as needed. for pain 11/05/17   [provider]  promethazine-codeine (PHENERGAN WITH CODEINE) 6.25-10 MG/5ML syrup Take 1-2 teaspoons every 4-6 hours as needed for cough. May cause drowsiness. 02/28/14   Lajean Manes, MD  terbinafine (LAMISIL) 250 MG tablet Take 1 tablet (250 mg total) by mouth daily. 12/13/17   Lenn Sink, DPM  vitamin B-12 (CYANOCOBALAMIN) 1000 MCG tablet Frequency:   Dosage:1000   MCG  Instructions:B-12 ( CAPS, Oral )  Note:    [provider]  Cetirizine HCl 10 MG CAPS Take 1 capsule (10 mg total) by mouth daily. 09/16/12 02/28/14  Floydene Flock, MD  Family History Family History  Problem Relation Age of Onset  . Diabetes Father     Social History Social History   Tobacco Use  . Smoking status: Never Smoker  . Smokeless tobacco: Never Used  Vaping Use  . Vaping Use: Never used  Substance Use Topics  . Alcohol use: No  . Drug use: No     Allergies   Amoxicillin and Oxycodone   Review of Systems Review of Systems  Constitutional: Negative for chills and fever.  HENT: Positive for congestion, sinus pressure, sinus pain and sore throat. Negative for ear pain, trouble swallowing and voice change.   Respiratory: Positive for cough. Negative for shortness of breath.    Cardiovascular: Negative for chest pain and palpitations.  Gastrointestinal: Negative for abdominal pain, diarrhea, nausea and vomiting.  Musculoskeletal: Positive for arthralgias, back pain and myalgias.  Skin: Negative for rash.  Neurological: Positive for headaches. Negative for dizziness and light-headedness.  All other systems reviewed and are negative.    Physical Exam Triage Vital Signs ED Triage Vitals [06/09/20 1327]  Enc Vitals Group     BP 132/80     Pulse Rate 69     Resp 18     Temp 98.1 F (36.7 C)     Temp Source Oral     SpO2 98 %     Weight      Height      Head Circumference      Peak Flow      Pain Score 3     Pain Loc      Pain Edu?      Excl. in GC?    No data found.  Updated Vital Signs BP 132/80 (BP Location: Right Arm)   Pulse 69   Temp 98.1 F (36.7 C) (Oral)   Resp 18   SpO2 98%   Visual Acuity Right Eye Distance:   Left Eye Distance:   Bilateral Distance:    Right Eye Near:   Left Eye Near:    Bilateral Near:     Physical Exam Vitals and nursing note reviewed.  Constitutional:      General: She is not in acute distress.    Appearance: She is well-developed and well-nourished. She is not ill-appearing, toxic-appearing or diaphoretic.  HENT:     Head: Normocephalic and atraumatic.     Right Ear: Tympanic membrane and ear canal normal.     Left Ear: Tympanic membrane and ear canal normal.     Nose: Mucosal edema present.     Right Sinus: Maxillary sinus tenderness and frontal sinus tenderness present.     Left Sinus: Maxillary sinus tenderness present. No frontal sinus tenderness.     Mouth/Throat:     Lips: Pink.     Mouth: Mucous membranes are moist.     Pharynx: Oropharynx is clear. Uvula midline.  Eyes:     Extraocular Movements: EOM normal.  Cardiovascular:     Rate and Rhythm: Normal rate.  Pulmonary:     Effort: Pulmonary effort is normal. No respiratory distress.     Breath sounds: Normal breath sounds. No  stridor. No wheezing, rhonchi or rales.  Musculoskeletal:        General: Tenderness present. Normal range of motion.     Cervical back: Normal range of motion and neck supple.       Back:  Lymphadenopathy:     Cervical: No cervical adenopathy.  Skin:    General: Skin is warm and  dry.  Neurological:     Mental Status: She is alert and oriented to person, place, and time.  Psychiatric:        Mood and Affect: Mood and affect normal.        Behavior: Behavior normal.      UC Treatments / Results  Labs (all labs ordered are listed, but only abnormal results are displayed) Labs Reviewed - No data to display  EKG   Radiology No results found.  Procedures Procedures (including critical care time)  Medications Ordered in UC Medications - No data to display  Initial Impression / Assessment and Plan / UC Course  I have reviewed the triage vital signs and the nursing notes.  Pertinent labs & imaging results that were available during my care of the patient were reviewed by me and considered in my medical decision making (see chart for details).    Exam c/w bacterial sinusitis  Muscular tenderness also noted on exam. Rx: doxycycline, flexeril  F/u with primary care as needed    Final Clinical Impressions(s) / UC Diagnoses   Final diagnoses:  COVID  Trapezius muscle spasm  Upper back pain on right side  Acute bacterial rhinosinusitis     Discharge Instructions      Please take antibiotics as prescribed and be sure to complete entire course even if you start to feel better to ensure infection does not come back.  You may take 500mg  acetaminophen every 4-6 hours or in combination with ibuprofen 400-600mg  every 6-8 hours as needed for pain, inflammation, and fever.  Be sure to well hydrated with clear liquids and get at least 8 hours of sleep at night, preferably more while sick.   Please follow up with family medicine in 1 week if needed.     ED Prescriptions     Medication Sig Dispense Auth. Provider   doxycycline (VIBRAMYCIN) 100 MG capsule Take 1 capsule (100 mg total) by mouth 2 (two) times daily for 7 days. 14 capsule , Forrest Demuro O, PA-C   cyclobenzaprine (FLEXERIL) 5 MG tablet Take 1 tablet (5 mg total) by mouth 3 (three) times daily as needed for muscle spasms. 12 tablet Doroteo Glassman, Adhvik Canady O, PA-C   benzonatate (TESSALON) 100 MG capsule Take 1 capsule (100 mg total) by mouth every 8 (eight) hours. 21 capsule Doroteo Glassman, Lurene Shadow     PDMP not reviewed this encounter.   [place], Lurene Shadow 06/09/20 [phone]

## 2020-06-09 NOTE — ED Triage Notes (Signed)
Pt c/o covid like sxs since 1/10. Was tested at home then re tested at a drive thru testing site at [employer]. Ibuprofen prn. No OTC meds for cough. Has had both covid vaccines.

## 2020-07-25 ENCOUNTER — Encounter: Payer: Self-pay | Admitting: Physician Assistant

## 2020-07-25 DIAGNOSIS — E890 Postprocedural hypothyroidism: Secondary | ICD-10-CM | POA: Insufficient documentation

## 2020-07-25 DIAGNOSIS — L80 Vitiligo: Secondary | ICD-10-CM | POA: Insufficient documentation

## 2020-07-25 DIAGNOSIS — Z6830 Body mass index (BMI) 30.0-30.9, adult: Secondary | ICD-10-CM | POA: Insufficient documentation

## 2020-07-25 DIAGNOSIS — F40243 Fear of flying: Secondary | ICD-10-CM | POA: Insufficient documentation

## 2020-07-25 DIAGNOSIS — E669 Obesity, unspecified: Secondary | ICD-10-CM | POA: Insufficient documentation

## 2020-07-26 ENCOUNTER — Ambulatory Visit (INDEPENDENT_AMBULATORY_CARE_PROVIDER_SITE_OTHER): Payer: BC Managed Care – PPO | Admitting: Physician Assistant

## 2020-07-26 ENCOUNTER — Other Ambulatory Visit: Payer: Self-pay

## 2020-07-26 ENCOUNTER — Encounter: Payer: Self-pay | Admitting: Physician Assistant

## 2020-07-26 VITALS — BP 120/78 | HR 66 | Temp 97.4°F | Ht 64.0 in | Wt 182.6 lb

## 2020-07-26 DIAGNOSIS — G8929 Other chronic pain: Secondary | ICD-10-CM

## 2020-07-26 DIAGNOSIS — M549 Dorsalgia, unspecified: Secondary | ICD-10-CM | POA: Diagnosis not present

## 2020-07-26 DIAGNOSIS — E890 Postprocedural hypothyroidism: Secondary | ICD-10-CM

## 2020-07-26 DIAGNOSIS — R0981 Nasal congestion: Secondary | ICD-10-CM

## 2020-07-26 LAB — TSH: TSH: 0.01 u[IU]/mL — ABNORMAL LOW (ref 0.35–4.50)

## 2020-07-26 LAB — COMPREHENSIVE METABOLIC PANEL
ALT: 30 U/L (ref 0–35)
AST: 26 U/L (ref 0–37)
Albumin: 4.2 g/dL (ref 3.5–5.2)
Alkaline Phosphatase: 101 U/L (ref 39–117)
BUN: 17 mg/dL (ref 6–23)
CO2: 29 mEq/L (ref 19–32)
Calcium: 10.2 mg/dL (ref 8.4–10.5)
Chloride: 103 mEq/L (ref 96–112)
Creatinine, Ser: 0.58 mg/dL (ref 0.40–1.20)
GFR: 102.4 mL/min (ref >60.00)
Glucose, Bld: 82 mg/dL (ref 70–99)
Potassium: 4 mEq/L (ref 3.5–5.1)
Sodium: 137 mEq/L (ref 135–145)
Total Bilirubin: 0.5 mg/dL (ref 0.2–1.2)
Total Protein: 7.3 g/dL (ref 6.0–8.3)

## 2020-07-26 LAB — T4, FREE
Free T4: 1.03
Free T4: 1.02 ng/dL (ref 0.60–1.60)

## 2020-07-26 MED ORDER — IPRATROPIUM BROMIDE 0.03 % NA SOLN: 2.0000 | Freq: Two times a day (BID) | NASAL | 12 refills | Status: DC

## 2020-07-26 MED ORDER — LEVOTHYROXINE SODIUM 125 MCG PO TABS: 125.0000 ug | ORAL_TABLET | Freq: Every day | ORAL | 1 refills | Status: DC

## 2020-07-26 NOTE — Progress Notes (Signed)
Tara Preston is a 55 y.o. female here for a follow up of a pre-existing problem.   History of Present Illness:   Chief Complaint  Patient presents with  . Establish Care  . Hypothyroidism    HPI   Was seen at [facility] and is wanting to transfer her care here.  Hypothyroidism History of hyperthyroidism. Had radiation and is now on thyroid supplementation. She has been on this for about 20 years. She is currently taking levothyroxine 125 mcg daily. She has concerns about her weight not decreasing despite adequate eating and exercise. She is compliant with this medication. Her TSH was last checked in April 2021 and was 0.94.  Nasal congestion Has had ongoing sinus congestion since COVID-19 in January 2022. Is taking sudafed BID. Has good control of symptoms as long as she takes medications. Was on a nasal spray during COVID and had good relief of symptoms.  Back pain Has had some mild low back discomfort since having COVID. Denies: fever, chills, dysuria, hematuria, hx of kidney stones. Does not take anything for her symptoms. Has not had bony pain or unintentional weight loss. UTD on colonoscopy.  Past Medical History:  Diagnosis Date  . Thyroid disease      Social History   Tobacco Use  . Smoking status: Never Smoker  . Smokeless tobacco: Never Used  Vaping Use  . Vaping Use: Never used  Substance Use Topics  . Alcohol use: Never  . Drug use: Never    Past Surgical History:  Procedure Laterality Date  . ABDOMINAL HYSTERECTOMY  2003  . BUNIONECTOMY Left 2018  . BUNIONECTOMY Right 2012  . OPTIC NERVE DECOMPRESSION  2005  . orbital decompression  2005  . STRABISMUS SURGERY  2006    Family History  Problem Relation Age of Onset  . Diabetes Father   . Cancer Father     Allergies  Allergen Reactions  . Amoxicillin     Pt think this is the ABT she is allergic to   . Other Other (See Comments)  . Oxycodone Itching    Current Medications:   Current  Outpatient Medications:  .  Cholecalciferol (VITAMIN D3) 25 MCG (1000 UT) CHEW, Chew 1 each by mouth daily., Disp: , Rfl:  .  estradiol (VIVELLE-DOT) 0.05 MG/24HR patch, Place 1 patch onto the skin 2 (two) times a week., Disp: , Rfl:  .  ipratropium (ATROVENT) 0.03 % nasal spray, Place 2 sprays into both nostrils every 12 (twelve) hours., Disp: 30 mL, Rfl: 12 .  Zinc 100 MG TABS, 1 tablet, Disp: , Rfl:  .  levothyroxine (SYNTHROID) 125 MCG tablet, Take 1 tablet (125 mcg total) by mouth daily., Disp: 90 tablet, Rfl: 1   Review of Systems:   ROS Negative unless otherwise specified per HPI.  Vitals:   Vitals:   07/26/20 1358  BP: 120/78  Pulse: 66  Temp: (!) 97.4 F (36.3 C)  TempSrc: Temporal  SpO2: 98%  Weight: 182 lb 9.6 oz (82.8 kg)  Height: 5\' 4"  (1.626 m)     Body mass index is 31.34 kg/m.  Physical Exam:   Physical Exam Vitals and nursing note reviewed.  Constitutional:      General: She is not in acute distress.    Appearance: She is well-developed. She is not ill-appearing, toxic-appearing or sickly-appearing.  Cardiovascular:     Rate and Rhythm: Normal rate and regular rhythm.     Pulses: Normal pulses.     Heart sounds: Normal  heart sounds, S1 normal and S2 normal.     Comments: No LE edema Pulmonary:     Effort: Pulmonary effort is normal.     Breath sounds: Normal breath sounds.  Musculoskeletal:     Comments: No decreased ROM 2/2 pain with flexion/extension, lateral side bends, or rotation. No TTP.   Skin:    General: Skin is warm, dry and intact.  Neurological:     Mental Status: She is alert.     GCS: GCS eye subscore is 4. GCS verbal subscore is 5. GCS motor subscore is 6.  Psychiatric:        Mood and Affect: Mood and affect normal.        Speech: Speech normal.        Behavior: Behavior normal. Behavior is cooperative.       Assessment and Plan:   [name] was seen today for establish care and hypothyroidism.  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 -     levothyroxine (SYNTHROID) 125 MCG tablet; Take 1 tablet (125 mcg total) by mouth daily.   CMA or LPN served as scribe during this visit. History, Physical, and Plan performed by medical provider. The above documentation has been reviewed and is accurate and complete.   Jarold Motto, PA-C

## 2020-07-26 NOTE — Patient Instructions (Signed)
It was great to see you!  Update blood work today.  Thyroid refill sent.  You will be contacted about your referral to endocrinology.  If a referral was placed today, you will be contacted for an appointment. Please note that routine referrals can sometimes take up to 3-4 weeks to process. Please call our office if you haven't heard anything after this time frame.  Take care,  Jarold Motto PA-C

## 2020-07-27 ENCOUNTER — Other Ambulatory Visit: Payer: Self-pay | Admitting: Physician Assistant

## 2020-07-27 MED ORDER — LEVOTHYROXINE SODIUM 112 MCG PO TABS: 112.0000 ug | ORAL_TABLET | Freq: Every day | ORAL | 0 refills | Status: DC

## 2020-10-04 ENCOUNTER — Encounter: Payer: Self-pay | Admitting: Family

## 2020-10-04 ENCOUNTER — Telehealth (INDEPENDENT_AMBULATORY_CARE_PROVIDER_SITE_OTHER): Payer: BC Managed Care – PPO | Admitting: Family

## 2020-10-04 VITALS — Ht 64.0 in | Wt 182.5 lb

## 2020-10-04 DIAGNOSIS — B9689 Other specified bacterial agents as the cause of diseases classified elsewhere: Secondary | ICD-10-CM

## 2020-10-04 DIAGNOSIS — J019 Acute sinusitis, unspecified: Secondary | ICD-10-CM

## 2020-10-04 DIAGNOSIS — J3489 Other specified disorders of nose and nasal sinuses: Secondary | ICD-10-CM

## 2020-10-04 MED ORDER — FLUTICASONE PROPIONATE 50 MCG/ACT NA SUSP: 2.0000 | Freq: Every day | NASAL | 6 refills | Status: DC

## 2020-10-04 MED ORDER — AMOXICILLIN 500 MG PO CAPS: 500.0000 mg | ORAL_CAPSULE | Freq: Three times a day (TID) | ORAL | 0 refills | Status: AC

## 2020-10-04 NOTE — Progress Notes (Signed)
   Virtual Visit via Video   I connected with patient on 10/04/20 at  4:20 PM EDT by a video enabled telemedicine application and verified that I am speaking with the correct person using two identifiers.  Location patient: Work Location provider: Adult nurse [place], Office Persons participating in the virtual visit: Ladona Horns   I discussed the limitations of evaluation and management by telemedicine and the availability of in person appointments. The patient expressed understanding and agreed to proceed.  Subjective:   HPI:  55 year old Hispanic female is being seen via video visit with complaints of congestion, cough, sinus pressure and pain x1 week and worsening.  Patient takes Zyrtec daily that has not been helping denies any known sick contacts.  She is a Engineer, site.  ROS:   See pertinent positives and negatives per HPI.  Patient Active Problem List   Diagnosis Date Noted  . 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 History of thyroidectomy 07/25/2020  . Body mass index (BMI) 30.0-30.9, adult 07/25/2020  . 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 Vitiligo 07/25/2020    Social History   Tobacco Use  . Smoking status: Never Smoker  . Smokeless tobacco: Never Used  Substance Use Topics  . Alcohol use: Never    Current Outpatient Medications:  .  amoxicillin (AMOXIL) 500 MG capsule, Take 1 capsule (500 mg total) by mouth 3 (three) times daily for 7 days., Disp: 21 capsule, Rfl: 0 .  estradiol (VIVELLE-DOT) 0.05 MG/24HR patch, Place 1 patch onto the skin 2 (two) times a week., Disp: , Rfl:  .  fluticasone (FLONASE) 50 MCG/ACT nasal spray, Place 2 sprays into both nostrils daily., Disp: 16 g, Rfl: 6 .  ipratropium (ATROVENT) 0.03 % nasal spray, Place 2 sprays into both nostrils every 12 (twelve) hours., Disp: 30 mL, Rfl: 12 .  levothyroxine (SYNTHROID) 112 MCG tablet, Take 1 tablet (112 mcg total) by mouth daily., Disp: 90 tablet, Rfl: 0 .  Cholecalciferol (VITAMIN D3)  25 MCG (1000 UT) CHEW, Chew 1 each by mouth daily. (Patient not taking: Reported on 10/04/2020), Disp: , Rfl:  .  Zinc 100 MG TABS, 1 tablet (Patient not taking: Reported on 10/04/2020), Disp: , Rfl:   Allergies  Allergen Reactions  . Amoxicillin     Patient says she is not allergic to Amoxil  . Other Other (See Comments)  . Oxycodone Itching    Objective:   Ht 5\' 4"  (1.626 m)   Wt 182 lb 8.7 oz (82.8 kg)   BMI 31.33 kg/m   Patient is well-developed, well-nourished in no acute distress.  Resting comfortably at work.  Head is normocephalic, atraumatic.  No labored breathing.  Speech is clear and coherent with logical content.  Patient is alert and oriented at baseline.    Assessment and Plan:    Zykira was seen today for nasal congestion, cough and headache.  Diagnoses and all orders for this visit:  Acute bacterial sinusitis  Frontal sinus pain  Other orders -     fluticasone (FLONASE) 50 MCG/ACT nasal spray; Place 2 sprays into both nostrils daily. -     amoxicillin (AMOXIL) 500 MG capsule; Take 1 capsule (500 mg total) by mouth 3 (three) times daily for 7 days.  Call the office with any questions or concerns.  Recheck as scheduled or sooner as needed.  As of note, patient denies allergy to amoxicillin  Christel Mormon, FNP 10/04/2020

## 2020-10-22 ENCOUNTER — Other Ambulatory Visit: Payer: Self-pay | Admitting: Physician Assistant

## 2020-11-17 ENCOUNTER — Other Ambulatory Visit: Payer: Self-pay

## 2020-11-17 ENCOUNTER — Encounter: Payer: Self-pay | Admitting: Family

## 2020-11-17 ENCOUNTER — Ambulatory Visit: Payer: BC Managed Care – PPO | Admitting: Family

## 2020-11-17 VITALS — BP 110/76 | HR 62 | Temp 98.2°F | Ht 64.0 in | Wt 185.8 lb

## 2020-11-17 DIAGNOSIS — F418 Other specified anxiety disorders: Secondary | ICD-10-CM

## 2020-11-17 MED ORDER — LORAZEPAM 1 MG PO TABS: 1.0000 mg | ORAL_TABLET | Freq: Two times a day (BID) | ORAL | 1 refills | Status: DC | PRN

## 2020-11-17 NOTE — Progress Notes (Signed)
Acute Office Visit  Subjective:    Patient ID: Tara Preston, female    DOB: [birth year], 55 y.o.   MRN: [id number]  Chief Complaint  Patient presents with  . Anxiety    Pt is flying to [place] and has to take 4 flights. She states that she is very nervous to have to fly so long.     HPI Patient is in today for a refill of lorazepam that she takes when she flies only.  Patient has a trip scheduled to fly to [place] to see her 15 year old mother and has a connecting flight at this time.  She is especially afraid of the takeoff and landing of the plane.  She has taken lorazepam in the past and has tolerated it well.  Ordinarily, she does not have any issues with anxiety.  Past Medical History:  Diagnosis Date  . Thyroid disease     Past Surgical History:  Procedure Laterality Date  . ABDOMINAL HYSTERECTOMY  2003  . BUNIONECTOMY Left 2018  . BUNIONECTOMY Right 2012  . OPTIC NERVE DECOMPRESSION  2005  . orbital decompression  2005  . STRABISMUS SURGERY  2006    Family History  Problem Relation Age of Onset  . Diabetes Father   . Cancer Father     Social History   Socioeconomic History  . Marital status: Married    Spouse name: Not on file  . Number of children: Not on file  . Years of education: Not on file  . Highest education level: Not on file  Occupational History  . Not on file  Tobacco Use  . Smoking status: Never  . Smokeless tobacco: Never  Vaping Use  . Vaping Use: Never used  Substance and Sexual Activity  . Alcohol use: Never  . Drug use: Never  . Sexual activity: Yes  Other Topics Concern  . 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 estradiol (VIVELLE-DOT) 0.05 MG/24HR patch Place 1 patch onto the skin 2 (two) times a week.    . fluticasone (FLONASE) 50 MCG/ACT nasal spray Place 2 sprays into both nostrils daily. 16 g 6  . ipratropium (ATROVENT) 0.03 % nasal spray Place 2 sprays into both nostrils every 12 (twelve) hours. 30 mL 12  . levothyroxine (SYNTHROID) 112 MCG tablet TAKE 1 TABLET BY MOUTH EVERY DAY 90 tablet 0  . Cholecalciferol (VITAMIN D3) 25 MCG (1000 UT) CHEW Chew 1 each by mouth daily. (Patient not taking: No sig reported)    . Zinc 100 MG TABS 1 tablet (Patient not taking: No sig reported)     No facility-administered medications prior to visit.    Allergies  Allergen Reactions  . Amoxicillin     Patient says she is not allergic to Amoxil  . Other Other (See Comments)  . Oxycodone Itching    Review of Systems  Constitutional: Negative.   Respiratory: Negative.    Cardiovascular: Negative.   Psychiatric/Behavioral:  The patient is nervous/anxious.        Nervous when flying  All other systems reviewed and are negative.  Objective:    Physical Exam Vitals reviewed.  Constitutional:      Appearance: Normal appearance.  Cardiovascular:     Rate and Rhythm: Normal rate and regular rhythm.     Pulses: Normal pulses.     Heart sounds: Normal heart sounds.  Pulmonary:     Effort: Pulmonary effort is normal.     Breath sounds: Normal breath sounds.  Musculoskeletal:     Cervical back: Normal range of motion and neck supple. No tenderness.  Neurological:     General: No focal deficit present.     Mental Status: She is alert and oriented to person, place, and time.  Psychiatric:        Mood and Affect: Mood normal.        Behavior: Behavior normal.   BP 110/76   Pulse 62   Temp 98.2 F (36.8 C) (Temporal)   Ht 5\' 4"  (1.626 m)   Wt 185 lb 12.8 oz (84.3 kg)   SpO2 97%   BMI 31.89 kg/m  Wt Readings from Last 3 Encounters:  11/17/20  185 lb 12.8 oz (84.3 kg)  10/04/20 182 lb 8.7 oz (82.8 kg)  07/26/20 182 lb 9.6 oz (82.8 kg)    Health Maintenance Due  Topic Date Due  . HIV Screening  Never done  . Hepatitis C Screening  Never done  . COLONOSCOPY (Pts 45-33yrs Insurance coverage will need to be confirmed)  Never done  . Zoster Vaccines- Shingrix (1 of 2) Never done  . MAMMOGRAM  04/28/2020    There are no preventive care reminders to display for this patient.   Lab Results  Component Value Date   TSH 0.01 Repeated and verified X2. (L) 07/26/2020   Lab Results  Component Value Date   WBC 6.7 08/24/2019   HGB 14.1 08/24/2019   HCT 41 08/24/2019   PLT 259 08/24/2019   Lab Results  Component Value Date   NA 137 07/26/2020   K 4.0 07/26/2020   CO2 29 07/26/2020   GLUCOSE 82 07/26/2020   BUN 17 07/26/2020   CREATININE 0.58 07/26/2020   BILITOT 0.5 07/26/2020   ALKPHOS 101 07/26/2020   AST 26 07/26/2020   ALT 30 07/26/2020   PROT 7.3 07/26/2020   ALBUMIN 4.2 07/26/2020   CALCIUM 10.2 07/26/2020   GFR 102.40 07/26/2020   Lab Results  Component Value Date   CHOL 199 12/24/2018   Lab Results  Component Value Date   HDL 59 12/24/2018   Lab Results  Component Value Date   LDLCALC 119 12/24/2018   Lab Results  Component Value Date   TRIG 104 12/24/2018   No results found for: CHOLHDL No results found for: 02/23/2019     Assessment & Plan:   Problem List Items Addressed This Visit   None Visit Diagnoses     Situational anxiety    -  Primary   Relevant Medications   LORazepam (ATIVAN) 1 MG tablet        Meds ordered this encounter  Medications  . LORazepam (ATIVAN) 1 MG tablet    Sig: Take 1 tablet (1 mg total) by mouth 2 (two) times daily as needed for anxiety. 30 min prior to flying    Dispense:  20 tablet    Refill:  1   Lorazepam 1 mg to be taken 30 minutes prior to flying.  Call the office with any questions or concerns.  Recheck as scheduled and sooner as needed.  [id number]  Clayborne Dana, FNP

## 2020-12-30 ENCOUNTER — Other Ambulatory Visit: Payer: Self-pay | Admitting: Physician Assistant

## 2020-12-30 DIAGNOSIS — Z1231 Encounter for screening mammogram for malignant neoplasm of breast: Secondary | ICD-10-CM

## 2021-01-04 ENCOUNTER — Ambulatory Visit: Payer: BC Managed Care – PPO

## 2021-01-05 ENCOUNTER — Ambulatory Visit
Admission: RE | Admit: 2021-01-05 | Discharge: 2021-01-05 | Disposition: A | Discharge status: Home / Self Care | Payer: BC Managed Care – PPO | Source: Ambulatory Visit | Attending: Physician Assistant | Admitting: Physician Assistant

## 2021-01-05 ENCOUNTER — Other Ambulatory Visit: Payer: Self-pay

## 2021-01-05 DIAGNOSIS — Z1231 Encounter for screening mammogram for malignant neoplasm of breast: Secondary | ICD-10-CM

## 2021-01-24 ENCOUNTER — Other Ambulatory Visit: Payer: Self-pay | Admitting: Family Medicine

## 2021-02-10 ENCOUNTER — Ambulatory Visit: Payer: BC Managed Care – PPO | Admitting: Physician Assistant

## 2021-02-10 ENCOUNTER — Other Ambulatory Visit: Payer: Self-pay

## 2021-02-10 VITALS — BP 122/76 | HR 81 | Temp 98.8°F | Ht 64.0 in | Wt 189.2 lb

## 2021-02-10 DIAGNOSIS — H04123 Dry eye syndrome of bilateral lacrimal glands: Secondary | ICD-10-CM

## 2021-02-10 NOTE — Progress Notes (Signed)
Subjective:    Patient ID: Tara Preston, female    DOB: 12-Dec-1965, 55 y.o.   MRN: [id number]  Chief Complaint  Patient presents with   Conjunctivitis    Conjunctivitis   Patient is in today for red eyes x 7 days. Worse in the mornings.  She has tried allergy eye drops without relief. Also trying Alaway drops and Visine drops. She has changed her make-up without relief. No pain. No discharge. No sick contacts. No sneezing or congested. Wears glasses, not contacts. Feels like something may be in her eyes. Dry and heavy feeling. Doesn't work around any metal. Doesn't recall anything hitting her eyes. Doesn't work outside.   Past Medical History:  Diagnosis Date   Thyroid disease     Past Surgical History:  Procedure Laterality Date   ABDOMINAL HYSTERECTOMY  2003   BUNIONECTOMY Left 2018   BUNIONECTOMY Right 2012   OPTIC NERVE DECOMPRESSION  2005   orbital decompression  2005   STRABISMUS SURGERY  2006    Family History  Problem Relation Age of Onset   Diabetes Father    Cancer Father    Breast cancer Maternal Aunt     Social History   Tobacco Use   Smoking status: Never   Smokeless tobacco: Never  Vaping Use   Vaping Use: Never used  Substance Use Topics   Alcohol use: Never   Drug use: Never     Allergies  Allergen Reactions   Amoxicillin     Patient says she is not allergic to Amoxil   Other Other (See Comments)   Oxycodone Itching    Review of Systems REFER TO HPI FOR PERTINENT POSITIVES AND NEGATIVES     Objective:     BP 122/76   Pulse 81   Temp 98.8 F (37.1 C)   Ht 5\' 4"  (1.626 m)   Wt 189 lb 3.2 oz (85.8 kg)   SpO2 98%   BMI 32.48 kg/m   Wt Readings from Last 3 Encounters:  02/10/21 189 lb 3.2 oz (85.8 kg)  11/17/20 185 lb 12.8 oz (84.3 kg)  10/04/20 182 lb 8.7 oz (82.8 kg)    BP Readings from Last 3 Encounters:  02/10/21 122/76  11/17/20 110/76  07/26/20 120/78     Physical Exam Eyes:     General: Lids are normal. Vision  grossly intact. Gaze aligned appropriately.        Right eye: No foreign body, discharge or hordeolum.        Left eye: No foreign body, discharge or hordeolum.     Extraocular Movements:     Right eye: Normal extraocular motion.     Left eye: Normal extraocular motion.     Pupils: Pupils are equal, round, and reactive to light.     Comments: Fluorescein stain performed in office with patient permission and this revealed no acute findings       Assessment & Plan:   Problem List Items Addressed This Visit   None Visit Diagnoses     Dry eyes, bilateral    -  Primary       1. Dry eyes, bilateral -Reassured no signs of infection or foreign body -Treat for dry eyes, see AVS   This note was prepared with assistance of Dragon voice recognition software. Occasional wrong-word or sound-a-like substitutions may have occurred due to the inherent limitations of voice recognition software.  Time Spent: 24 minutes of total time was spent on the date of the encounter  performing the following actions: chart review prior to seeing the patient, obtaining history, performing a medically necessary exam, counseling on the treatment plan, placing orders, and documenting in our EHR.    Nobel Brar M Staley Lunz, PA-C

## 2021-02-10 NOTE — Patient Instructions (Signed)
Good news, no evidence of foreign body or infection in eyes. Most likely dry eyes. Stop using allergy eye drops. Use REFRESH PF drops OTC - two drops in the eyes at least three times daily. Use warm compresses on the eyes.   See an eye doctor if worse or no better.

## 2021-03-31 ENCOUNTER — Encounter: Payer: Self-pay | Admitting: Physician Assistant

## 2021-03-31 ENCOUNTER — Ambulatory Visit (INDEPENDENT_AMBULATORY_CARE_PROVIDER_SITE_OTHER): Payer: BC Managed Care – PPO | Admitting: Physician Assistant

## 2021-03-31 ENCOUNTER — Other Ambulatory Visit: Payer: Self-pay

## 2021-03-31 VITALS — BP 120/72 | HR 68 | Temp 98.1°F | Ht 64.5 in | Wt 187.0 lb

## 2021-03-31 DIAGNOSIS — E890 Postprocedural hypothyroidism: Secondary | ICD-10-CM

## 2021-03-31 DIAGNOSIS — E669 Obesity, unspecified: Secondary | ICD-10-CM | POA: Diagnosis not present

## 2021-03-31 DIAGNOSIS — Z0001 Encounter for general adult medical examination with abnormal findings: Secondary | ICD-10-CM | POA: Diagnosis not present

## 2021-03-31 DIAGNOSIS — H04123 Dry eye syndrome of bilateral lacrimal glands: Secondary | ICD-10-CM

## 2021-03-31 LAB — CBC WITH DIFFERENTIAL/PLATELET
Basophils Absolute: 0 10*3/uL (ref 0.0–0.1)
Basophils Relative: 0.7 % (ref 0.0–3.0)
Eosinophils Absolute: 0.2 10*3/uL (ref 0.0–0.7)
Eosinophils Relative: 3.2 % (ref 0.0–5.0)
HCT: 41.4 % (ref 36.0–46.0)
Hemoglobin: 14 g/dL (ref 12.0–15.0)
Lymphocytes Relative: 37.1 % (ref 12.0–46.0)
Lymphs Abs: 2.2 10*3/uL (ref 0.7–4.0)
MCHC: 33.8 g/dL (ref 30.0–36.0)
MCV: 91.6 fl (ref 78.0–100.0)
Monocytes Absolute: 0.4 10*3/uL (ref 0.1–1.0)
Monocytes Relative: 7.5 % (ref 3.0–12.0)
Neutro Abs: 3 10*3/uL (ref 1.4–7.7)
Neutrophils Relative %: 51.5 % (ref 43.0–77.0)
Platelets: 261 10*3/uL (ref 150.0–400.0)
RBC: 4.52 Mil/uL (ref 3.87–5.11)
RDW: 13.7 % (ref 11.5–15.5)
WBC: 5.8 10*3/uL (ref 4.0–10.5)

## 2021-03-31 LAB — LDL CHOLESTEROL, DIRECT: Direct LDL: 117 mg/dL

## 2021-03-31 LAB — COMPREHENSIVE METABOLIC PANEL
ALT: 24 U/L (ref 0–35)
AST: 24 U/L (ref 0–37)
Albumin: 4.5 g/dL (ref 3.5–5.2)
Alkaline Phosphatase: 115 U/L (ref 39–117)
BUN: 22 mg/dL (ref 6–23)
CO2: 28 mEq/L (ref 19–32)
Calcium: 10.3 mg/dL (ref 8.4–10.5)
Chloride: 104 mEq/L (ref 96–112)
Creatinine, Ser: 0.75 mg/dL (ref 0.40–1.20)
GFR: 89.66 mL/min (ref >60.00)
Glucose, Bld: 81 mg/dL (ref 70–99)
Potassium: 4.2 mEq/L (ref 3.5–5.1)
Sodium: 139 mEq/L (ref 135–145)
Total Bilirubin: 0.3 mg/dL (ref 0.2–1.2)
Total Protein: 7.8 g/dL (ref 6.0–8.3)

## 2021-03-31 LAB — LIPID PANEL
Cholesterol: 215 mg/dL — ABNORMAL HIGH (ref 0–200)
HDL: 56.8 mg/dL (ref >39.00)
NonHDL: 158.31
Total CHOL/HDL Ratio: 4
Triglycerides: 232 mg/dL — ABNORMAL HIGH (ref 0.0–149.0)
VLDL: 46.4 mg/dL — ABNORMAL HIGH (ref 0.0–40.0)

## 2021-03-31 LAB — TSH: TSH: 0.03 u[IU]/mL — ABNORMAL LOW (ref 0.35–5.50)

## 2021-03-31 LAB — HEMOGLOBIN A1C: Hgb A1c MFr Bld: 6 % (ref 4.6–6.5)

## 2021-03-31 NOTE — Addendum Note (Signed)
Addended by: Lorn Junes on: 03/31/2021 03:45 PM   Modules accepted: Orders

## 2021-03-31 NOTE — Patient Instructions (Addendum)
It was great to see you!  *please bring/send in your colonoscopy results for [place]  I will be in touch with your thyroid results and recommendations  Please go to the lab for blood work.   Our office will call you with your results unless you have chosen to receive results via MyChart.  If your blood work is normal we will follow-up each year for physicals and as scheduled for chronic medical problems.  If anything is abnormal we will treat accordingly and get you in for a follow-up.  Take care,  [name]

## 2021-03-31 NOTE — Addendum Note (Signed)
Addended by: Lorn Junes on: 03/31/2021 03:48 PM   Modules accepted: Orders

## 2021-03-31 NOTE — Progress Notes (Signed)
Subjective:    Tara Preston is a 55 y.o. female and is here for a comprehensive physical exam.   HPI  Health Maintenance Due  Topic Date Due   HIV Screening  Never done   Hepatitis C Screening  Never done   COLONOSCOPY (Pts 45-110yrs Insurance coverage will need to be confirmed)  Never done   Zoster Vaccines- Shingrix (1 of 2) Never done   COVID-19 Vaccine (4 - Booster for Pfizer series) 07/23/2020    Acute Concerns: Bilateral Dry Eye Mrs. Coppinger has previously seen [employer] [place] about this issue on 02/10/21. At that time pt reported she was experiencing red eyes for a week, worse in the mornings. Pt described feeling to be dry, heavy, and as if something was in her eyes.   She had tried allergy eye drops such as visine and Alaway which provided no relief. Initially she believed sx to be caused by makeup, so she changed her products but this still didn't provide any relief. Denied contact use, recent injury/trauma, and environmental changes.  Following this visit she was prescribed REFRESH PF drops but this provided no relief. According to [facility] she did visit another provider and also found no relief upon receiving a topical cream. She is interested in a referral to an ophthalmologist.    Chronic Issues: Hypothyroidism Currently compliant with taking synthroid 112 mcg with no adverse effects. Denies brittle nails, hair loss, heat/cold intolerance, or palpitations. She is  managing well.   Health Maintenance: Immunizations -- COVID- Last completed 05/28/20 Proofreader- 3 doses) Tdap- Last completed 10/30/19 Influenza- Last completed 02/18/21 Colonoscopy -- Reported to be completed in 2021. Mammogram -- Last completed 01/05/21 PAP -- Not completed; hysterectomy  Bone Density -- N/A Diet -- Well balanced diet  Caffeine Intake-  Stopped drinking coffee three years ago. Sleep habits -- Normal Exercise -- Exercises occasionally  Weight -- Stable Wt Readings from Last 3  Encounters:  03/31/21 187 lb (84.8 kg)  02/10/21 189 lb 3.2 oz (85.8 kg)  11/17/20 185 lb 12.8 oz (84.3 kg)    Mood -- Stable Weight history: Wt Readings from Last 10 Encounters:  03/31/21 187 lb (84.8 kg)  02/10/21 189 lb 3.2 oz (85.8 kg)  11/17/20 185 lb 12.8 oz (84.3 kg)  10/04/20 182 lb 8.7 oz (82.8 kg)  07/26/20 182 lb 9.6 oz (82.8 kg)  02/28/14 189 lb (85.7 kg)  09/16/12 189 lb (85.7 kg)  08/12/11 191 lb (86.6 kg)  07/01/11 191 lb 8 oz (86.9 kg)   Body mass index is 31.6 kg/m. No LMP recorded. Patient has had a hysterectomy. Alcohol use:  reports no history of alcohol use. Tobacco use:  Tobacco Use: Low Risk    Smoking Tobacco Use: Never   Smokeless Tobacco Use: Never   Passive Exposure: Not on file     Depression screen PHQ 2/9 03/31/2021  Decreased Interest 0  Down, Depressed, Hopeless 0  PHQ - 2 Score 0     Other providers/specialists: Patient Care Team: Jarold Motto, [place] as PCP - General (Physician Assistant)    PMHx, SurgHx, SocialHx, Medications, and Allergies were reviewed in the Visit Navigator and updated as appropriate.   Past Medical History:  Diagnosis Date   Thyroid disease      Past Surgical History:  Procedure Laterality Date   ABDOMINAL HYSTERECTOMY  2003   BUNIONECTOMY Left 2018   BUNIONECTOMY Right 2012   OPTIC NERVE DECOMPRESSION  2005   orbital decompression  2005  STRABISMUS SURGERY  2006     Family History  Problem Relation Age of Onset   Diabetes Father    Cancer Father    Breast cancer Maternal Aunt     Social History   Tobacco Use   Smoking status: Never   Smokeless tobacco: Never  Vaping Use   Vaping Use: Never used  Substance Use Topics   Alcohol use: Never   Drug use: Never    Review of Systems:   Review of Systems  Constitutional:  Negative for chills, fever, malaise/fatigue and weight loss.  HENT:  Negative for hearing loss, sinus pain and sore throat.   Respiratory:  Negative for cough and  hemoptysis.   Cardiovascular:  Negative for chest pain, palpitations, leg swelling and PND.  Gastrointestinal:  Negative for abdominal pain, constipation, diarrhea, heartburn, nausea and vomiting.  Genitourinary:  Negative for dysuria, frequency and urgency.  Musculoskeletal:  Negative for back pain, myalgias and neck pain.  Skin:  Negative for itching and rash.  Neurological:  Negative for dizziness, tingling, seizures and headaches.  Endo/Heme/Allergies:  Negative for polydipsia.  Psychiatric/Behavioral:  Negative for depression. The patient is not nervous/anxious.     Objective:   BP 120/72 (BP Location: Left Arm, Patient Position: Sitting, Cuff Size: Normal)   Pulse 68   Temp 98.1 F (36.7 C) (Temporal)   Ht 5' 4.5" (1.638 m)   Wt 187 lb (84.8 kg)   SpO2 95%   BMI 31.60 kg/m  Body mass index is 31.6 kg/m.   General Appearance:    Alert, cooperative, no distress, appears stated age  Head:    Normocephalic, without obvious abnormality, atraumatic  Eyes:    PERRL, b/l conjunctiva injected with clear drainage, EOM's intact, fundi    benign, both eyes  Ears:    Normal TM's and external ear canals, both ears  Nose:   Nares normal, septum midline, mucosa normal, no drainage    or sinus tenderness  Throat:   Lips, mucosa, and tongue normal; teeth and gums normal  Neck:   Supple, symmetrical, trachea midline, no adenopathy;    thyroid:  no enlargement/tenderness/nodules; no carotid   bruit or JVD  Back:     Symmetric, no curvature, ROM normal, no CVA tenderness  Lungs:     Clear to auscultation bilaterally, respirations unlabored  Chest Wall:    No tenderness or deformity   Heart:    Regular rate and rhythm, S1 and S2 normal, no murmur, rub or gallop  Breast Exam:    Deferred  Abdomen:     Soft, non-tender, bowel sounds active all four quadrants,    no masses, no organomegaly  Genitalia:    Deferred  Extremities:   Extremities normal, atraumatic, no cyanosis or edema  Pulses:    2+ and symmetric all extremities  Skin:   Skin color, texture, turgor normal, no rashes or lesions  Lymph nodes:   Cervical, supraclavicular, and axillary nodes normal  Neurologic:   CNII-XII intact, normal strength, sensation and reflexes    throughout    Assessment/Plan:   Encounter for general adult medical examination with abnormal findings Today patient counseled on age appropriate routine health concerns for screening and prevention, each reviewed and up to date or declined. Immunizations reviewed and up to date or declined. Labs ordered and reviewed. Risk factors for depression reviewed and negative. Hearing function and visual acuity are intact. ADLs screened and addressed as needed. Functional ability and level of safety reviewed and appropriate.  Education, counseling and referrals performed based on assessed risks today. Patient provided with a copy of personalized plan for preventive services.   Postablative hypothyroidism Unfortunately she did not follow-up for blood work when last dosage change Update TSH and adjust levothyroxine 112 mcg daily Follow-up based on level of control of results   Obesity, unspecified classification, unspecified obesity type, unspecified whether serious comorbidity present Continue to work on healthy eating and exercise as able   Dry eyes, bilateral Uncontrolled Referral to optho per patient request No obvious source of infection on my exam   Patient Counseling: [x]    Nutrition: Stressed importance of moderation in sodium/caffeine intake, saturated fat and cholesterol, caloric balance, sufficient intake of fresh fruits, vegetables, fiber, calcium, iron, and 1 mg of folate supplement per day (for females capable of pregnancy).  [x]    Stressed the importance of regular exercise.   [x]    Substance Abuse: Discussed cessation/primary prevention of tobacco, alcohol, or other drug use; driving or other dangerous activities under the influence;  availability of treatment for abuse.   [x]    Injury prevention: Discussed safety belts, safety helmets, smoke detector, smoking near bedding or upholstery.   [x]    Sexuality: Discussed sexually transmitted diseases, partner selection, use of condoms, avoidance of unintended pregnancy  and contraceptive alternatives.  [x]    Dental health: Discussed importance of regular tooth brushing, flossing, and dental visits.  [x]    Health maintenance and immunizations reviewed. Please refer to Health maintenance section.   I,Havlyn C Ratchford,acting as a for , PA.,have documented all relevant documentation on the behalf of , PA,as directed by  , PA while in the presence of , .   I, , Neurosurgeon, have reviewed all documentation for this visit. The documentation on 03/31/21 for the exam, diagnosis, procedures, and orders are all accurate and complete.   Jarold Motto, PA-C [facility]

## 2021-03-31 NOTE — Addendum Note (Signed)
Addended by: Lorn Junes on: 03/31/2021 03:46 PM   Modules accepted: Orders

## 2021-04-01 ENCOUNTER — Encounter: Payer: Self-pay | Admitting: Physician Assistant

## 2021-04-03 ENCOUNTER — Encounter: Payer: Self-pay | Admitting: Physician Assistant

## 2021-04-03 ENCOUNTER — Other Ambulatory Visit: Payer: Self-pay | Admitting: Physician Assistant

## 2021-04-03 DIAGNOSIS — E890 Postprocedural hypothyroidism: Secondary | ICD-10-CM

## 2021-04-03 MED ORDER — LEVOTHYROXINE SODIUM 100 MCG PO TABS: 100.0000 ug | ORAL_TABLET | Freq: Every day | ORAL | 0 refills | Status: DC

## 2021-04-03 NOTE — Telephone Encounter (Signed)
Tara Preston, I have abstracted pt's colonoscopy.

## 2021-04-03 NOTE — Telephone Encounter (Signed)
Spoke to pt told her in regards to her sugar the test that was done measure your sugar levels for the past 3 months and you did not need to be fasting. Please work on diet and exercise. Pt verbalized understanding. Also the thyroid is out of control and new Rx was sent to pharmacy need to schedule lab appt only in 4-6 weeks to recheck your thyroid. Pt verbalized understanding and asked if she had to fast. Told pt no you do not. Pt verbalized understanding and will call back and schedule lab appt.

## 2021-04-03 NOTE — Telephone Encounter (Signed)
See other Mychart message.

## 2021-04-25 ENCOUNTER — Other Ambulatory Visit: Payer: Self-pay | Admitting: Physician Assistant

## 2021-04-28 ENCOUNTER — Other Ambulatory Visit: Payer: Self-pay

## 2021-04-28 ENCOUNTER — Other Ambulatory Visit (INDEPENDENT_AMBULATORY_CARE_PROVIDER_SITE_OTHER): Payer: BC Managed Care – PPO

## 2021-04-28 ENCOUNTER — Other Ambulatory Visit: Payer: Self-pay | Admitting: Family

## 2021-04-28 ENCOUNTER — Encounter: Payer: Self-pay | Admitting: Physician Assistant

## 2021-04-28 ENCOUNTER — Other Ambulatory Visit: Payer: Self-pay | Admitting: Physician Assistant

## 2021-04-28 DIAGNOSIS — E890 Postprocedural hypothyroidism: Secondary | ICD-10-CM | POA: Diagnosis not present

## 2021-04-28 LAB — TSH: TSH: 0.08 u[IU]/mL — ABNORMAL LOW (ref 0.35–5.50)

## 2021-04-29 ENCOUNTER — Other Ambulatory Visit: Payer: Self-pay | Admitting: Physician Assistant

## 2021-04-29 MED ORDER — LEVOTHYROXINE SODIUM 88 MCG PO TABS: 88.0000 ug | ORAL_TABLET | Freq: Every day | ORAL | 1 refills | Status: DC

## 2021-05-01 ENCOUNTER — Telehealth: Payer: Self-pay

## 2021-05-01 ENCOUNTER — Other Ambulatory Visit: Payer: Self-pay | Admitting: *Deleted

## 2021-05-01 MED ORDER — LEVOTHYROXINE SODIUM 88 MCG PO TABS: 88.0000 ug | ORAL_TABLET | Freq: Every day | ORAL | 1 refills | Status: DC

## 2021-05-01 NOTE — Telephone Encounter (Signed)
Spoke to pt, see result notes. 

## 2021-05-01 NOTE — Telephone Encounter (Signed)
Patient is calling in stating that the pharmacy is having issues with the levothyroxine (SYNTHROID) 100 mg, she said the pharmacy is needing clarification.

## 2021-05-29 ENCOUNTER — Encounter: Payer: Self-pay | Admitting: Family Medicine

## 2021-05-29 ENCOUNTER — Other Ambulatory Visit: Payer: BC Managed Care – PPO

## 2021-05-29 ENCOUNTER — Other Ambulatory Visit: Payer: Self-pay

## 2021-05-29 ENCOUNTER — Ambulatory Visit (INDEPENDENT_AMBULATORY_CARE_PROVIDER_SITE_OTHER): Payer: BC Managed Care – PPO | Admitting: Family Medicine

## 2021-05-29 ENCOUNTER — Other Ambulatory Visit: Payer: Self-pay | Admitting: Physician Assistant

## 2021-05-29 VITALS — BP 122/84 | HR 70 | Temp 97.7°F | Ht 64.0 in | Wt 196.5 lb

## 2021-05-29 DIAGNOSIS — J0140 Acute pansinusitis, unspecified: Secondary | ICD-10-CM

## 2021-05-29 DIAGNOSIS — E890 Postprocedural hypothyroidism: Secondary | ICD-10-CM | POA: Diagnosis not present

## 2021-05-29 MED ORDER — AMOXICILLIN-POT CLAVULANATE 875-125 MG PO TABS: 1.0000 | ORAL_TABLET | Freq: Two times a day (BID) | ORAL | 0 refills | Status: DC

## 2021-05-29 NOTE — Patient Instructions (Signed)
Meds have been sent the the pharmacy °You can take tylenol for pain/fevers °If worsening symptoms, let us know or go to the Emergency room  ° ° °

## 2021-05-29 NOTE — Progress Notes (Signed)
Subjective:     Patient ID: Tara Preston, female    DOB: [birth year], 56 y.o.   MRN: [id number]  Chief Complaint  Patient presents with   Nasal Congestion    Started a week ago Negative covid test   Headache    Sinus headache Has been taking Sudafed, not helping     HPI-was on pred for eyes d/t try eyes.  Off since early Dec 1.5 wks sick.  Pain L lateral tongue then sinus, then head on L.  Then sore throat and drainage.  Tested twice and covid neg. Last checked this am.  Couldn't sleep last pm d/t pain and now R frontal.  Stuffy L>R.  A lot of mucus.  Rare cough.no sob/v/d Appetite varies.  No rash. No canker sore.   No f/c  Health Maintenance Due  Topic Date Due   HIV Screening  Never done   Hepatitis C Screening  Never done   Zoster Vaccines- Shingrix (1 of 2) Never done   COVID-19 Vaccine (4 - Booster for Pfizer series) 07/23/2020    Past Medical History:  Diagnosis Date   Thyroid disease     Past Surgical History:  Procedure Laterality Date   ABDOMINAL HYSTERECTOMY  2003   BUNIONECTOMY Left 2018   BUNIONECTOMY Right 2012   EYE SURGERY  desompression and strabism   OPTIC NERVE DECOMPRESSION  2005   orbital decompression  2005   STRABISMUS SURGERY  2006    Outpatient Medications Prior to Visit  Medication Sig Dispense Refill   estradiol (VIVELLE-DOT) 0.05 MG/24HR patch Place 1 patch onto the skin 2 (two) times a week.     fluticasone (FLONASE) 50 MCG/ACT nasal spray SPRAY 2 SPRAYS INTO EACH NOSTRIL EVERY DAY 48 mL 2   levothyroxine (SYNTHROID) 88 MCG tablet Take 1 tablet (88 mcg total) by mouth daily. 30 tablet 1   LORazepam (ATIVAN) 1 MG tablet Take 1 tablet (1 mg total) by mouth 2 (two) times daily as needed for anxiety. 30 min prior to flying 20 tablet 1   RESTASIS 0.05 % ophthalmic emulsion      Cholecalciferol (VITAMIN D3) 25 MCG (1000 UT) CHEW Chew 1 each by mouth daily. (Patient not taking: Reported on 03/31/2021)     prednisoLONE acetate (PRED  FORTE) 1 % ophthalmic suspension 1 drop 2 (two) times daily. (Patient not taking: Reported on 05/29/2021)     No facility-administered medications prior to visit.    Allergies  Allergen Reactions   Other Other (See Comments)   Oxycodone Itching   CNO:[id number] except as noted in HPI      Objective:     BP 122/84    Pulse 70    Temp 97.7 F (36.5 C) (Temporal)    Ht 5\' 4"  (1.626 m)    Wt 196 lb 8 oz (89.1 kg)    SpO2 96%    BMI 33.73 kg/m  Wt Readings from Last 3 Encounters:  05/29/21 196 lb 8 oz (89.1 kg)  03/31/21 187 lb (84.8 kg)  02/10/21 189 lb 3.2 oz (85.8 kg)        Gen: WDWN NAD. OHF HEENT: NCAT, conjunctiva not injected, sclera nonicteric TM WNL B, OP moist, no exudates   no oral lesions.  All sinuses tender NECK:  supple, no thyromegaly, no nodes, no carotid bruits CARDIAC: RRR, S1S2+, no murmur. DP 2+B LUNGS: CTAB. No wheezes EXT:  no edema MSK: no gross abnormalities.  NEURO: A&O x3.  CN II-XII intact.  PSYCH: normal mood. Good eye contact  Assessment & Plan:   Problem List Items Addressed This Visit   None Visit Diagnoses     Acute non-recurrent pansinusitis    -  Primary      Sinusitis-augmentin. Declines pred for now  No orders of the defined types were placed in this encounter.   Angelena Sole, MD

## 2021-05-30 ENCOUNTER — Other Ambulatory Visit: Payer: Self-pay | Admitting: Physician Assistant

## 2021-05-30 LAB — TSH: TSH: 0.87 u[IU]/mL (ref 0.35–5.50)

## 2021-05-30 MED ORDER — LEVOTHYROXINE SODIUM 88 MCG PO TABS: 88.0000 ug | ORAL_TABLET | Freq: Every day | ORAL | 1 refills | Status: DC

## 2021-06-14 ENCOUNTER — Ambulatory Visit: Payer: BC Managed Care – PPO | Admitting: Physician Assistant

## 2021-06-14 ENCOUNTER — Other Ambulatory Visit: Payer: Self-pay

## 2021-06-14 ENCOUNTER — Encounter: Payer: Self-pay | Admitting: Physician Assistant

## 2021-06-14 VITALS — BP 100/70 | HR 73 | Temp 98.1°F | Ht 64.0 in | Wt 197.0 lb

## 2021-06-14 DIAGNOSIS — R051 Acute cough: Secondary | ICD-10-CM | POA: Diagnosis not present

## 2021-06-14 MED ORDER — ALBUTEROL SULFATE HFA 108 (90 BASE) MCG/ACT IN AERS: 2.0000 | INHALATION_SPRAY | Freq: Four times a day (QID) | RESPIRATORY_TRACT | 0 refills | Status: DC | PRN

## 2021-06-14 NOTE — Progress Notes (Signed)
Tara Preston is a 56 y.o. female here for cough.   History of Present Illness:   Chief Complaint  Patient presents with   Cough    Pt c/o dry non-productive cough with wheezing, nasal congestion yellow/green to clear drainage. Denies fever or chills. Home COVID test Negative.    Cough Tara Preston presents with c/o dry non- productive cough that has been onset for 3 weeks. States that she has been having wheezing along with her cough as well as nasal congestion with yellow/green discharge. Pt has seen Dr. Ruthine Dose about this issue on 05/29/21. At that time she was having nasal congestion, headache on left side, sore throat, and drainage. After evaluation she was prescribed augmentin 825 mg twice daily. Currently pt states she was compliant with this medication and felt better after completion.   Unfortunately, short after completion, her symptoms had returned. Initial sx was left ear pain that radiated down her neck and into the left side of her head. She did take an OTC medication but it provided relief of sx. Although this was now resolved, her current sx developed shortly after. At this time she is finding it hard to sleep and eat as often due to congestion and overall discomfort.   Denies: Fever, chills, SOB, or CP  She did take a COVID test at home which resulted as negative.   Past Medical History:  Diagnosis Date   Thyroid disease      Social History   Tobacco Use   Smoking status: Never   Smokeless tobacco: Never  Vaping Use   Vaping Use: Never used  Substance Use Topics   Alcohol use: Never   Drug use: Never    Past Surgical History:  Procedure Laterality Date   ABDOMINAL HYSTERECTOMY  2003   BUNIONECTOMY Left 2018   BUNIONECTOMY Right 2012   EYE SURGERY  desompression and strabism   OPTIC NERVE DECOMPRESSION  2005   orbital decompression  2005   STRABISMUS SURGERY  2006    Family History  Problem Relation Age of Onset   Diabetes Father    Cancer Father     Breast cancer Maternal Aunt     Allergies  Allergen Reactions   Other Other (See Comments)   Oxycodone Itching    Current Medications:   Current Outpatient Medications:    Cholecalciferol (VITAMIN D3) 25 MCG (1000 UT) CHEW, Chew 1 each by mouth daily., Disp: , Rfl:    estradiol (VIVELLE-DOT) 0.05 MG/24HR patch, Place 1 patch onto the skin 2 (two) times a week., Disp: , Rfl:    fluticasone (FLONASE) 50 MCG/ACT nasal spray, SPRAY 2 SPRAYS INTO EACH NOSTRIL EVERY DAY, Disp: 48 mL, Rfl: 2   levothyroxine (SYNTHROID) 88 MCG tablet, Take 1 tablet (88 mcg total) by mouth daily., Disp: 90 tablet, Rfl: 1   LORazepam (ATIVAN) 1 MG tablet, Take 1 tablet (1 mg total) by mouth 2 (two) times daily as needed for anxiety. 30 min prior to flying, Disp: 20 tablet, Rfl: 1   prednisoLONE acetate (PRED FORTE) 1 % ophthalmic suspension, 1 drop 2 (two) times daily., Disp: , Rfl:    RESTASIS 0.05 % ophthalmic emulsion, , Disp: , Rfl:    Review of Systems:   Review of Systems  Respiratory:  Positive for cough.   Negative unless otherwise specified per HPI. Vitals:   Vitals:   06/14/21 1614  BP: 100/70  Pulse: 73  Temp: 98.1 F (36.7 C)  TempSrc: Temporal  SpO2: 94%  Weight:  197 lb (89.4 kg)  Height: 5\' 4"  (1.626 m)     Body mass index is 33.81 kg/m.  Physical Exam:   Physical Exam Vitals and nursing note reviewed.  Constitutional:      General: She is not in acute distress.    Appearance: She is well-developed. She is not ill-appearing or toxic-appearing.  Cardiovascular:     Rate and Rhythm: Normal rate and regular rhythm.     Pulses: Normal pulses.     Heart sounds: Normal heart sounds, S1 normal and S2 normal.  Pulmonary:     Effort: Pulmonary effort is normal.     Breath sounds: Normal breath sounds.  Skin:    General: Skin is warm and dry.  Neurological:     Mental Status: She is alert.     GCS: GCS eye subscore is 4. GCS verbal subscore is 5. GCS motor subscore is 6.   Psychiatric:        Speech: Speech normal.        Behavior: Behavior normal. Behavior is cooperative.    Assessment and Plan:   Acute Cough No red flags Suspect viral URI; no indication for abx at this time  Use albuterol 108 mcg base inhaler every 6 hours as needed  Trial otc mucinex for additional relief Encouraged patient to push more fluids and rest Follow up if new/worsening or no improvement of symptoms or concerns occur  I,Havlyn C Ratchford,acting as a scribe for , PA.,have documented all relevant documentation on the behalf of [employer], PA,as directed by  Jarold Motto, PA while in the presence of Jarold Motto, Jarold Motto.  I, [place], Jarold Motto, have reviewed all documentation for this visit. The documentation on 06/14/21 for the exam, diagnosis, procedures, and orders are all accurate and complete.  06/16/21, PA-C

## 2021-06-14 NOTE — Patient Instructions (Signed)
It was great to see you!  You have a viral upper respiratory infection. Antibiotics are not needed for this.  Viral infections usually take 7-10 days to resolve.  The cough can last a few weeks to go away.  Use medication as prescribed: albuterol inhaler as needed  Please trial over the counter mucinex (generic is fine)  Push fluids and get plenty of rest. Please return if you are not improving as expected, or if you have high fevers (>101.5) or difficulty swallowing or worsening productive cough.  Call clinic with questions.  I hope you start feeling better soon!

## 2021-06-19 ENCOUNTER — Telehealth: Payer: Self-pay | Admitting: Physician Assistant

## 2021-06-19 ENCOUNTER — Other Ambulatory Visit: Payer: Self-pay

## 2021-06-19 MED ORDER — PREDNISONE 20 MG PO TABS: 20.0000 mg | ORAL_TABLET | Freq: Two times a day (BID) | ORAL | 0 refills | Status: DC

## 2021-06-19 NOTE — Telephone Encounter (Signed)
Please see message and advise 

## 2021-06-19 NOTE — Telephone Encounter (Signed)
Patient is calling back in regard.  Is requesting script to get sent as soon as possible to [facility] in [facility].

## 2021-06-19 NOTE — Telephone Encounter (Signed)
Pt states she would like to have something else called in due to not feeling better. She stated Jarold Motto mentioned Prednizone in their appt on 06/14/21 but pt refused. She would like to try that or something else. Please advise

## 2021-06-20 NOTE — Telephone Encounter (Signed)
Spoke to pt asked her if she picked up Rx for Prednisone told her it was sent yesterday. Pt said and she is feeling a little better. Told her okay.

## 2021-06-23 ENCOUNTER — Encounter: Payer: Self-pay | Admitting: Physician Assistant

## 2021-06-23 ENCOUNTER — Other Ambulatory Visit: Payer: Self-pay

## 2021-06-23 ENCOUNTER — Ambulatory Visit: Payer: BC Managed Care – PPO | Admitting: Physician Assistant

## 2021-06-23 VITALS — BP 116/78 | HR 84 | Temp 97.8°F | Ht 64.0 in | Wt 196.4 lb

## 2021-06-23 DIAGNOSIS — R052 Subacute cough: Secondary | ICD-10-CM | POA: Diagnosis not present

## 2021-06-23 MED ORDER — METHYLPREDNISOLONE ACETATE 80 MG/ML IJ SUSP
80.0000 mg | Freq: Once | INTRAMUSCULAR | Status: AC
  Administered 2021-06-23: 80 mg via INTRAMUSCULAR

## 2021-06-23 MED ORDER — AZITHROMYCIN 250 MG PO TABS: ORAL_TABLET | ORAL | 0 refills | Status: AC

## 2021-06-23 NOTE — Addendum Note (Signed)
Addended by: Jimmye Norman on: 06/23/2021 04:58 PM   Modules accepted: Orders

## 2021-06-23 NOTE — Patient Instructions (Addendum)
It was great to see you!  Steroid injection today  Start azithromycin antibiotic  Push fluids and get plenty of rest. Please return if you are not improving as expected, or if you have high fevers (>101.5) or difficulty swallowing or worsening productive cough.  An order for an xray has been put in for you. To get your xray, you can walk in at the [facility] location without a scheduled appointment.  The address is [address]. It is across the street from [place] is located in the basement.  Hours of operation are M-F 8:30am to 5:00pm. Please note that they are closed for lunch between 12:30 and 1:00pm.   Call clinic with questions.  I hope you start feeling better soon!

## 2021-06-23 NOTE — Progress Notes (Signed)
Tara Preston is a 56 y.o. female here for cough.  History of Present Illness:   Chief Complaint  Patient presents with   Cough    Pt c/o persistent non productive cough, wheezing. Wakes up coughing and can't catch breath.     Cough Since our prior visit on 06/14/21, pt has been compliant with using albuterol 108 mcg base inhaler and mucinex but was provided with no relief. States she felt that mucinex caused her to violently dry heave which made her scared, so she switched to mucinex with honey. Despite this improving the quality of her cough it did not improve how often she was coughing. Additionally she has been compliant with taking prednisone 20 mg twice daily x 2 days, which initially provided relief but has since plateaued. At this time, pt is interested in a possible chest xray and different medication. Denies CP or SOB.   Past Medical History:  Diagnosis Date   Thyroid disease      Social History   Tobacco Use   Smoking status: Never   Smokeless tobacco: Never  Vaping Use   Vaping Use: Never used  Substance Use Topics   Alcohol use: Never   Drug use: Never    Past Surgical History:  Procedure Laterality Date   ABDOMINAL HYSTERECTOMY  2003   BUNIONECTOMY Left 2018   BUNIONECTOMY Right 2012   EYE SURGERY  desompression and strabism   OPTIC NERVE DECOMPRESSION  2005   orbital decompression  2005   STRABISMUS SURGERY  2006    Family History  Problem Relation Age of Onset   Diabetes Father    Cancer Father    Breast cancer Maternal Aunt     Allergies  Allergen Reactions   Other Other (See Comments)   Oxycodone Itching    Current Medications:   Current Outpatient Medications:    albuterol (VENTOLIN HFA) 108 (90 Base) MCG/ACT inhaler, Inhale 2 puffs into the lungs every 6 (six) hours as needed for wheezing or shortness of breath., Disp: 8 g, Rfl: 0   estradiol (VIVELLE-DOT) 0.05 MG/24HR patch, Place 1 patch onto the skin 2 (two) times a week., Disp: , Rfl:     fluticasone (FLONASE) 50 MCG/ACT nasal spray, SPRAY 2 SPRAYS INTO EACH NOSTRIL EVERY DAY, Disp: 48 mL, Rfl: 2   levothyroxine (SYNTHROID) 88 MCG tablet, Take 1 tablet (88 mcg total) by mouth daily., Disp: 90 tablet, Rfl: 1   LORazepam (ATIVAN) 1 MG tablet, Take 1 tablet (1 mg total) by mouth 2 (two) times daily as needed for anxiety. 30 min prior to flying, Disp: 20 tablet, Rfl: 1   prednisoLONE acetate (PRED FORTE) 1 % ophthalmic suspension, 1 drop 2 (two) times daily., Disp: , Rfl:    predniSONE (DELTASONE) 20 MG tablet, Take 1 tablet (20 mg total) by mouth 2 (two) times daily with a meal., Disp: 10 tablet, Rfl: 0   RESTASIS 0.05 % ophthalmic emulsion, , Disp: , Rfl:    Cholecalciferol (VITAMIN D3) 25 MCG (1000 UT) CHEW, Chew 1 each by mouth daily. (Patient not taking: Reported on 06/23/2021), Disp: , Rfl:    Review of Systems:   Review of Systems  Respiratory:  Positive for cough.   Negative unless otherwise specified per HPI. Vitals:   Vitals:   06/23/21 1603  BP: 116/78  Pulse: 84  Temp: 97.8 F (36.6 C)  TempSrc: Temporal  SpO2: 97%  Weight: 196 lb 6.4 oz (89.1 kg)  Height: 5\' 4"  (1.626 m)  Body mass index is 33.71 kg/m.  Physical Exam:   Physical Exam Vitals and nursing note reviewed.  Constitutional:      General: She is not in acute distress.    Appearance: She is well-developed. She is not ill-appearing or toxic-appearing.  Cardiovascular:     Rate and Rhythm: Normal rate and regular rhythm.     Pulses: Normal pulses.     Heart sounds: Normal heart sounds, S1 normal and S2 normal.  Pulmonary:     Effort: Pulmonary effort is normal.     Breath sounds: Normal breath sounds.  Skin:    General: Skin is warm and dry.  Neurological:     Mental Status: She is alert.     GCS: GCS eye subscore is 4. GCS verbal subscore is 5. GCS motor subscore is 6.  Psychiatric:        Speech: Speech normal.        Behavior: Behavior normal. Behavior is cooperative.     Assessment and Plan:   Subacute cough No red flags  Start azithromycin 250 mg daily, 2 tablets on day one then 1 tablets days 2-5  Depo-medrol injection provided today, 80 mg Will order chest x-ray for further evaluation per patient's request  Encouraged patient to continue to push fluids and rest  Follow up based on results   I,Havlyn C Ratchford,acting as a scribe for [employer], PA.,have documented all relevant documentation on the behalf of Jarold Motto, PA,as directed by  Jarold Motto, PA while in the presence of Jarold Motto, [place].  I, Jarold Motto, [place], have reviewed all documentation for this visit. The documentation on 06/23/21 for the exam, diagnosis, procedures, and orders are all accurate and complete.   Jarold Motto, PA-C

## 2021-08-16 ENCOUNTER — Encounter: Payer: Self-pay | Admitting: Physician Assistant

## 2021-08-16 ENCOUNTER — Telehealth (INDEPENDENT_AMBULATORY_CARE_PROVIDER_SITE_OTHER): Payer: BC Managed Care – PPO | Admitting: Physician Assistant

## 2021-08-16 ENCOUNTER — Other Ambulatory Visit: Payer: Self-pay | Admitting: *Deleted

## 2021-08-16 ENCOUNTER — Other Ambulatory Visit: Payer: Self-pay | Admitting: Physician Assistant

## 2021-08-16 VITALS — Ht 64.0 in | Wt 190.0 lb

## 2021-08-16 DIAGNOSIS — T7840XA Allergy, unspecified, initial encounter: Secondary | ICD-10-CM

## 2021-08-16 MED ORDER — MONTELUKAST SODIUM 10 MG PO TABS: 10.0000 mg | ORAL_TABLET | Freq: Every day | ORAL | 1 refills | Status: DC

## 2021-08-16 MED ORDER — QVAR REDIHALER 40 MCG/ACT IN AERB: 1.0000 | INHALATION_SPRAY | Freq: Two times a day (BID) | RESPIRATORY_TRACT | 0 refills | Status: DC

## 2021-08-16 NOTE — Progress Notes (Signed)
? ?Virtual Visit via Video Note  ? ?IInda Coke, connected with  Tara Preston  ([id number], [birth year]) on 08/16/21 at 11:20 AM EDT by a video-enabled telemedicine application and verified that I am speaking with the correct person using two identifiers. ? ?Location: ?Patient: Home ?Provider: [place] office ?  ?I discussed the limitations of evaluation and management by telemedicine and the availability of in person appointments. The patient expressed understanding and agreed to proceed.   ? ?History of Present Illness: ?Tara Preston is a 56 y.o. who identifies as a female who was assigned female at birth, and is being seen today for allergies. ? ?Patient reports that she is taking zyrtec 10 mg twice daily and using albuterol prn for wheezing. She does not feel like this helping. Denies: severe cough, chest pain, SOB, LE swelling, fever, chills, body aches. She also endorses itchy eyes, sneezing. Her sister is doing allergy shots. ? ?Problems:  ?Patient Active Problem List  ? Diagnosis Date Noted  ? 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 History of thyroidectomy 07/25/2020  ? Body mass index (BMI) 30.0-30.9, adult 07/25/2020  ? 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 Vitiligo 07/25/2020  ?  ?Allergies:  ?Allergies  ?Allergen Reactions  ? Other Other (See Comments)  ? Oxycodone Itching  ? ?Medications:  ?Current Outpatient Medications:  ?  albuterol (VENTOLIN HFA) 108 (90 Base) MCG/ACT inhaler, Inhale 2 puffs into the lungs every 6 (six) hours as needed for wheezing or shortness of breath., Disp: 8 g, Rfl: 0 ?  Cholecalciferol (VITAMIN D3) 25 MCG (1000 UT) CHEW, Chew 1 each by mouth daily., Disp: , Rfl:  ?  estradiol (VIVELLE-DOT) 0.05 MG/24HR patch, Place 1 patch onto the skin 2 (two) times a week., Disp: , Rfl:  ?  fluticasone (FLONASE) 50 MCG/ACT nasal spray, SPRAY 2 SPRAYS INTO EACH NOSTRIL EVERY DAY, Disp: 48 mL, Rfl: 2 ?  levothyroxine (SYNTHROID) 88 MCG tablet, Take 1 tablet (88 mcg total) by  mouth daily., Disp: 90 tablet, Rfl: 1 ?  LORazepam (ATIVAN) 1 MG tablet, Take 1 tablet (1 mg total) by mouth 2 (two) times daily as needed for anxiety. 30 min prior to flying, Disp: 20 tablet, Rfl: 1 ?  prednisoLONE acetate (PRED FORTE) 1 % ophthalmic suspension, 1 drop 2 (two) times daily., Disp: , Rfl:  ?  predniSONE (DELTASONE) 20 MG tablet, Take 1 tablet (20 mg total) by mouth 2 (two) times daily with a meal., Disp: 10 tablet, Rfl: 0 ?  RESTASIS 0.05 % ophthalmic emulsion, , Disp: , Rfl:  ? ?Observations/Objective: ?Patient is well-developed, well-nourished in no acute distress.  ?Resting comfortably  at home.  ?Head is normocephalic, atraumatic.  ?No labored breathing.  ?Speech is clear and coherent with logical content.  ?Patient is alert and oriented at baseline.  ? ?Assessment and Plan: ?1. Allergic disorder, initial encounter ?Uncontrolled ?No red flags ?Start QVAR 40 mcg 1 puff BID ?Start singulair 10 mg nightly ?Continue zyrtec ?Continue albuterol inhaler as needed ?Follow-up if remains uncontrolled ? ?Follow Up Instructions: ?I discussed the assessment and treatment plan with the patient. The patient was provided an opportunity to ask questions and all were answered. The patient agreed with the plan and demonstrated an understanding of the instructions.  A copy of instructions were sent to the patient via MyChart unless otherwise noted below.  ? ?The patient was advised to call back or seek an in-person evaluation if the symptoms worsen or if the condition fails to improve as  anticipated. ? ?Inda Coke, PA ?

## 2021-08-16 NOTE — Telephone Encounter (Signed)
Pharmacy comment: Alternative Requested:THE PRESCRIBED MEDICATION IS NOT COVERED BY INSURANCE. PLEASE CONSIDER CHANGING TO ONE OF THE SUGGESTED COVERED ALTERNATIVES.

## 2021-08-17 ENCOUNTER — Other Ambulatory Visit: Payer: Self-pay | Admitting: Physician Assistant

## 2021-08-17 MED ORDER — FLUTICASONE PROPIONATE HFA 44 MCG/ACT IN AERO: 1.0000 | INHALATION_SPRAY | Freq: Two times a day (BID) | RESPIRATORY_TRACT | 12 refills | Status: DC

## 2021-08-17 NOTE — Telephone Encounter (Signed)
I have sent in Flovent for patient. ?

## 2021-09-07 ENCOUNTER — Other Ambulatory Visit: Payer: Self-pay | Admitting: Physician Assistant

## 2021-09-28 IMAGING — MG MM DIGITAL SCREENING BILAT W/ TOMO AND CAD
8 series · 8 of 24 positions shown · non-contrast
Comparison: Previous exam(s).

CLINICAL DATA: Screening.

EXAM:
DIGITAL SCREENING BILATERAL MAMMOGRAM WITH TOMOSYNTHESIS AND CAD
TECHNIQUE: Bilateral screening digital craniocaudal and mediolateral oblique
mammograms were obtained. Bilateral screening digital breast
tomosynthesis was performed. The images were evaluated with
computer-aided detection.

[L MLO synth-2D]
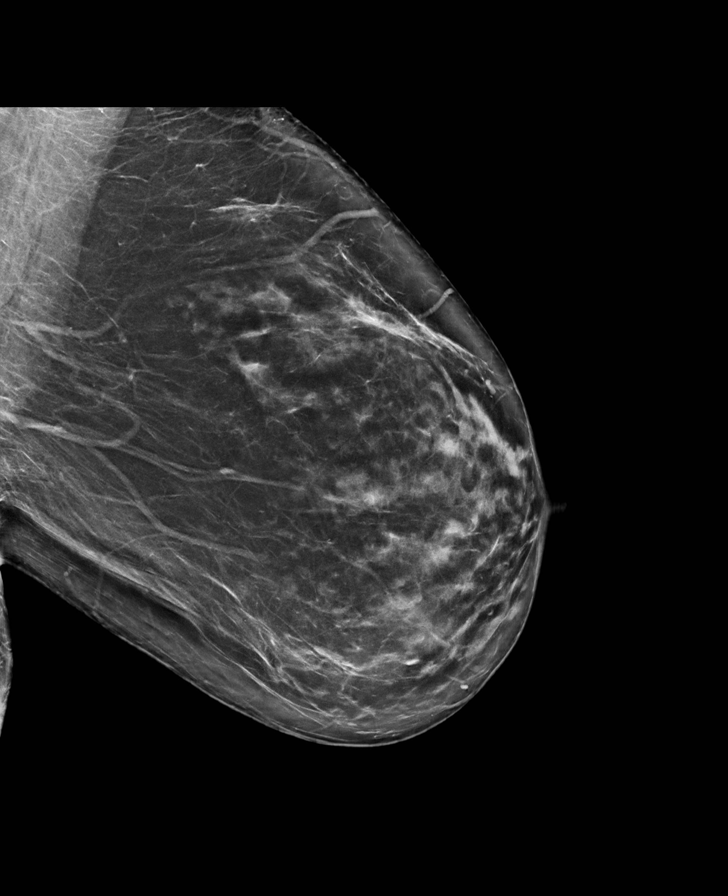

[R CC synth-2D]
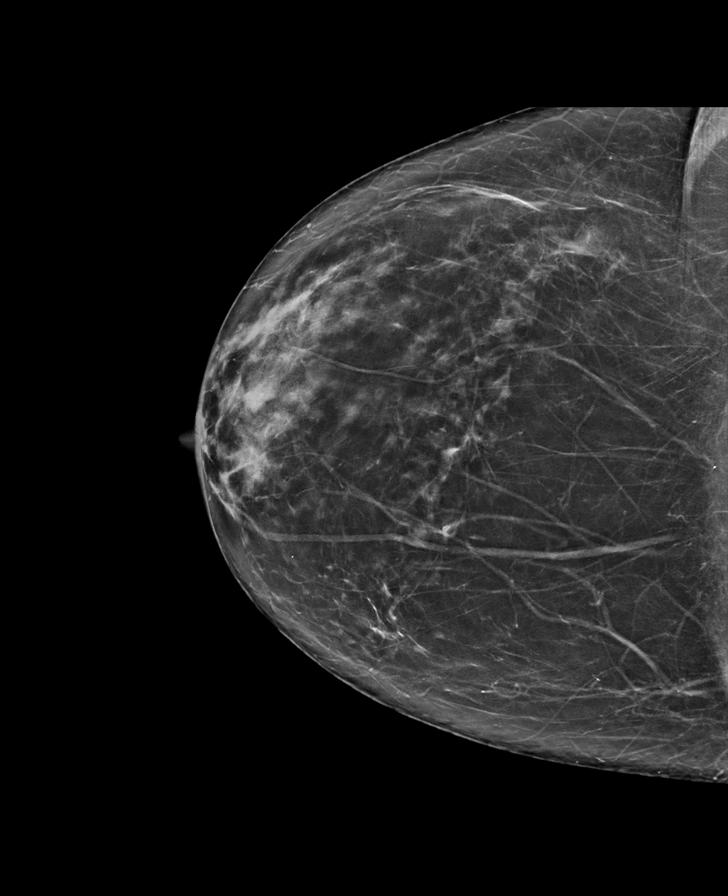

[L CC synth-2D]
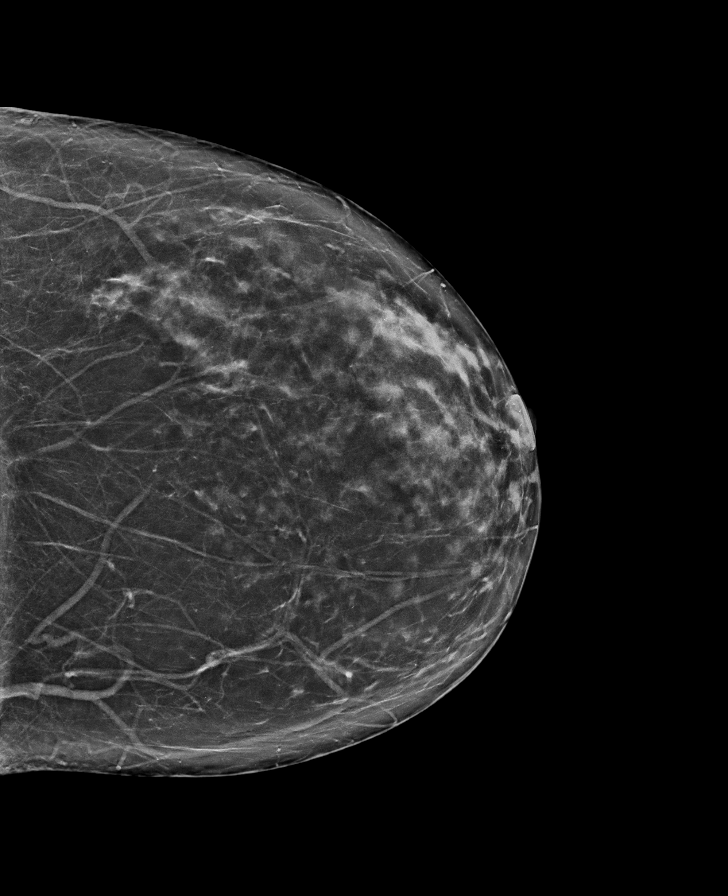

[R MLO synth-2D]
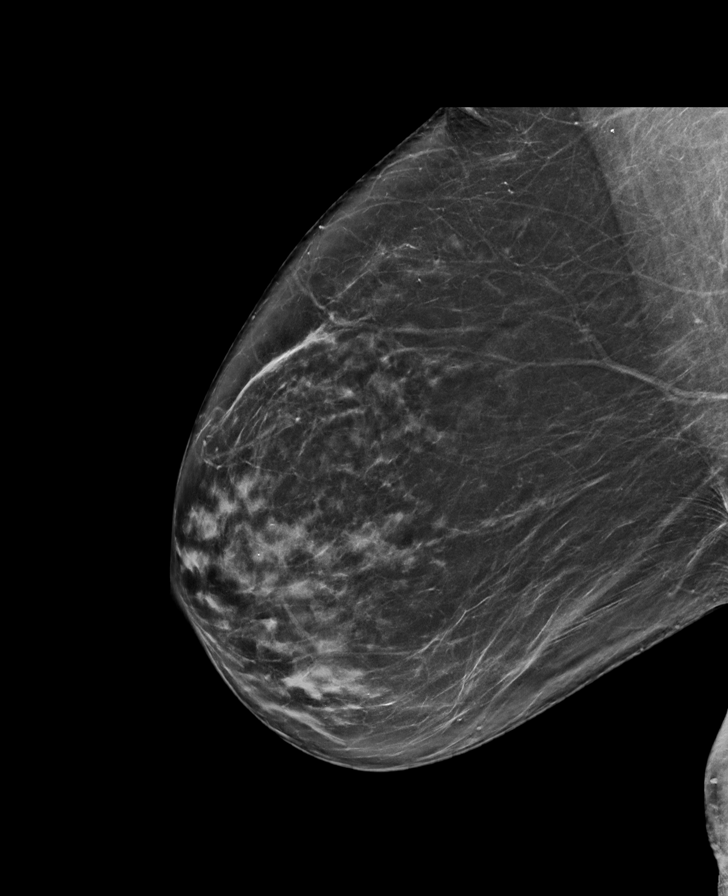

[R MLO tomo · tomo slice 43/86.0]
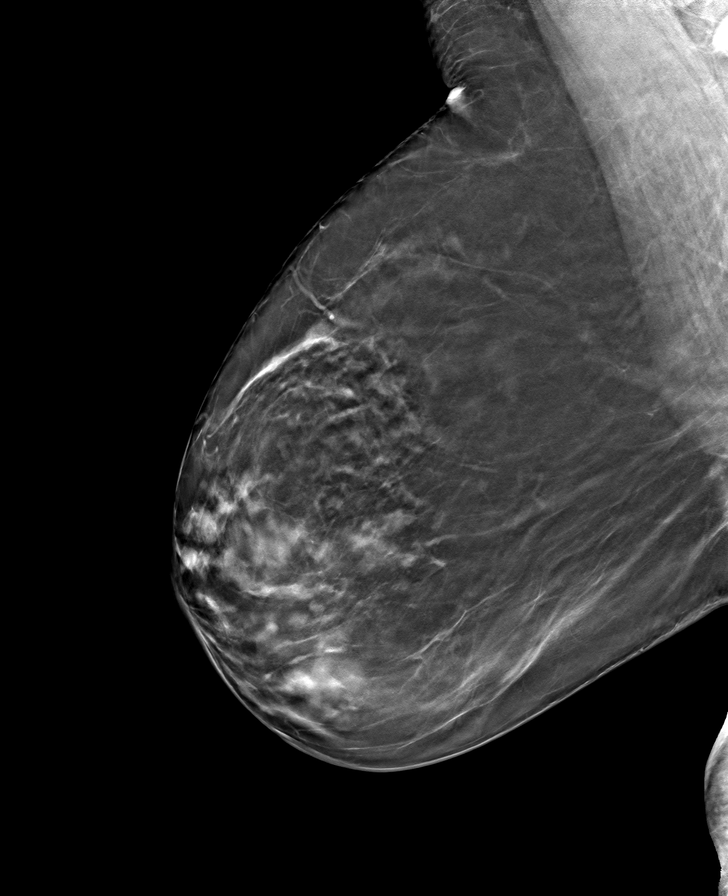

[L MLO tomo · tomo slice 45/89.0]
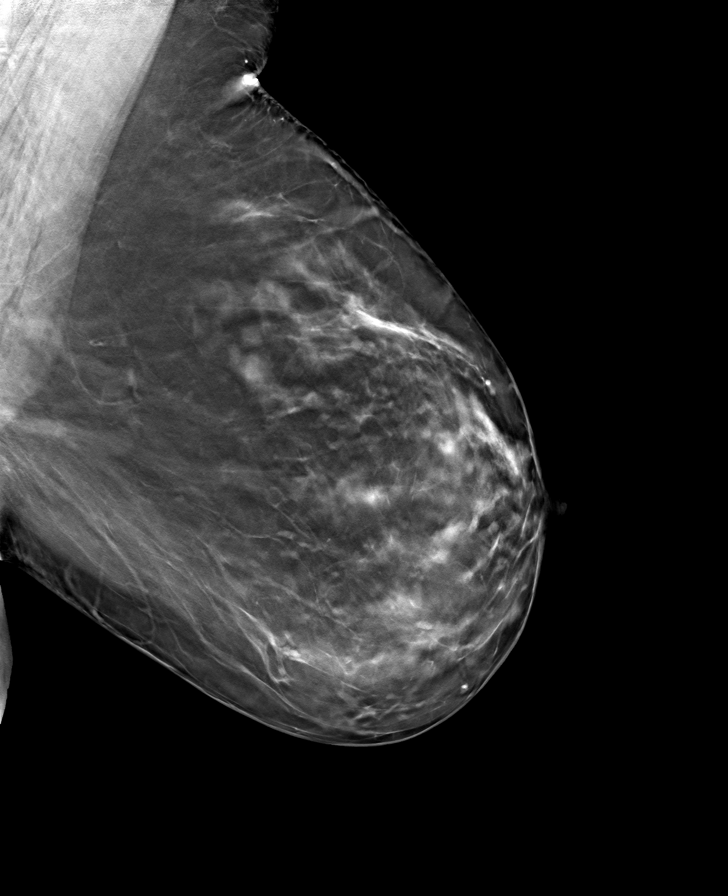

[L CC tomo · tomo slice 39/78.0]
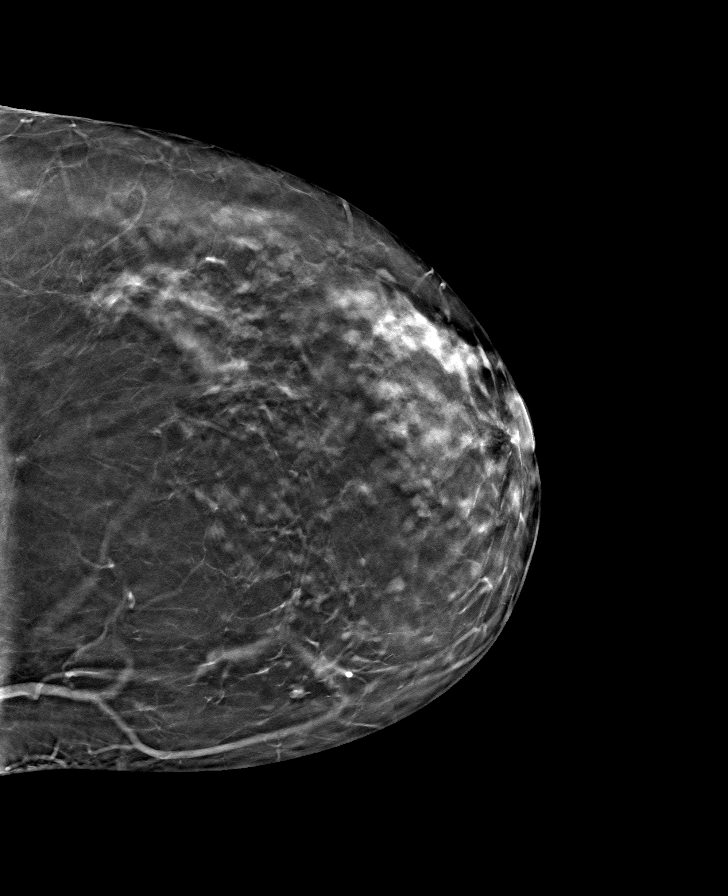

[R CC tomo · tomo slice 39/78.0]
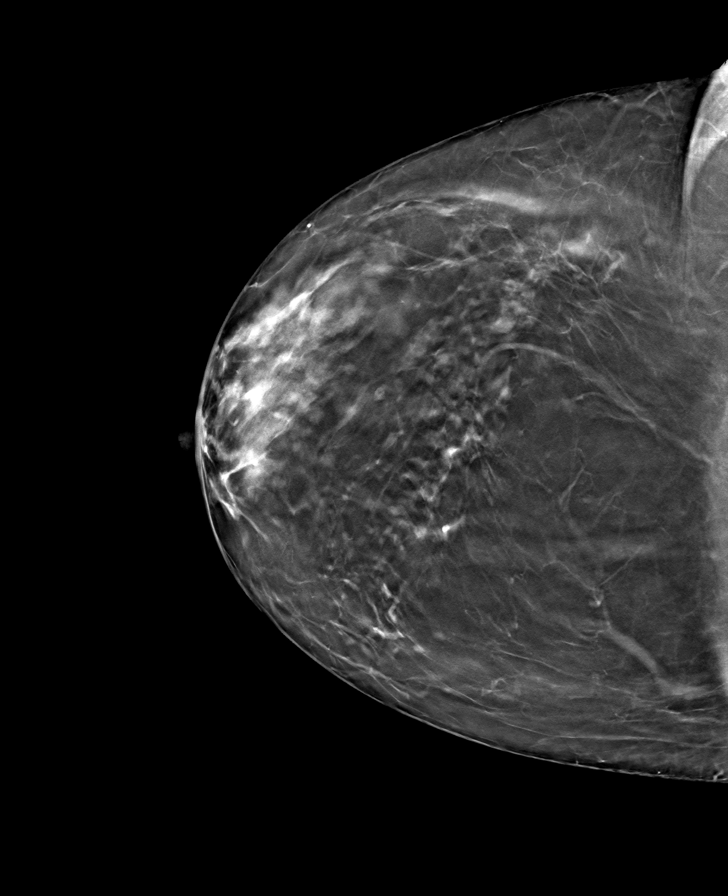

[8 of 24 positions shown; findings below may reference images not displayed]

ACR Breast Density Category c: The breast tissue is heterogeneously
dense, which may obscure small masses.
FINDINGS: There are no findings suspicious for malignancy.
IMPRESSION: No mammographic evidence of malignancy. A result letter of this
screening mammogram will be mailed directly to the patient.

RECOMMENDATION:
Screening mammogram in one year. (Code:Q3-W-BC3)

BI-RADS CATEGORY  1: Negative.

## 2021-10-07 ENCOUNTER — Other Ambulatory Visit: Payer: Self-pay | Admitting: Physician Assistant

## 2021-11-13 ENCOUNTER — Other Ambulatory Visit: Payer: Self-pay | Admitting: Physician Assistant

## 2021-12-31 ENCOUNTER — Other Ambulatory Visit: Payer: Self-pay | Admitting: Physician Assistant

## 2022-01-01 ENCOUNTER — Other Ambulatory Visit: Payer: Self-pay | Admitting: Physician Assistant

## 2022-01-01 DIAGNOSIS — Z1231 Encounter for screening mammogram for malignant neoplasm of breast: Secondary | ICD-10-CM

## 2022-01-02 ENCOUNTER — Encounter: Payer: Self-pay | Admitting: Physician Assistant

## 2022-01-02 ENCOUNTER — Ambulatory Visit: Payer: BC Managed Care – PPO | Admitting: Physician Assistant

## 2022-01-02 VITALS — BP 122/80 | HR 64 | Temp 97.7°F | Ht 64.0 in | Wt 201.4 lb

## 2022-01-02 DIAGNOSIS — E785 Hyperlipidemia, unspecified: Secondary | ICD-10-CM

## 2022-01-02 DIAGNOSIS — E88819 Insulin resistance, unspecified: Secondary | ICD-10-CM

## 2022-01-02 DIAGNOSIS — E890 Postprocedural hypothyroidism: Secondary | ICD-10-CM | POA: Diagnosis not present

## 2022-01-02 DIAGNOSIS — M25561 Pain in right knee: Secondary | ICD-10-CM

## 2022-01-02 DIAGNOSIS — E8881 Metabolic syndrome: Secondary | ICD-10-CM | POA: Diagnosis not present

## 2022-01-02 DIAGNOSIS — G8929 Other chronic pain: Secondary | ICD-10-CM

## 2022-01-02 MED ORDER — MELOXICAM 7.5 MG PO TABS: 7.5000 mg | ORAL_TABLET | Freq: Every day | ORAL | 0 refills | Status: DC

## 2022-01-02 NOTE — Patient Instructions (Signed)
It was great to see you!  An order for an xray has been put in for you. To get your xray, you can walk in at the [facility] location without a scheduled appointment.  The address is [address]. It is across the street from [facility]. X-ray is located in the basement.  Hours of operation are M-F 8:30am to 5:00pm. Please note that they are closed for lunch between 12:30 and 1:00pm.  For your knee pain -- start mobic 7.5 mg daily x 2 weeks. Then you can stop this or take as needed. May continue your compression sleeve.  I will be in touch with your xray and lab results.  Please make an appointment with the lab on your way out. I would like for you to return for lab work within 1-2 weeks. After midnight on the day of the lab draw, please do not eat anything. You may have water, black coffee, unsweetened tea.  Take care,  Jarold Motto PA-C

## 2022-01-02 NOTE — Progress Notes (Signed)
Tara Preston is a 56 y.o. female here for a follow up of a pre-existing problem.  History of Present Illness:   Chief Complaint  Patient presents with   Knee Pain    Pt fell in June and landed on right knee. Pt c/o pain and swelling right knee x 3 weeks.    HPI  R knee pain R knee fell onto the tile floor in the kitchen in June. Had bruising and swelling. After 3 weeks, started to have pain with bending motions. Now having pain with walking. Uses a knee sleeve. She took some OTC NSAIDs for her symptoms and this did help. Denies weakness.  Hypothyroidism Currently taking levothyroxine 88 mcg daily. Tolerating well. Denies concerns and would like this rechecked today.  HLD; Insulin Resistance  She would like her lipids  and A1c checked later when she is fasting. She requesting future blood work.  Past Medical History:  Diagnosis Date   Thyroid disease      Social History   Tobacco Use   Smoking status: Never   Smokeless tobacco: Never  Vaping Use   Vaping Use: Never used  Substance Use Topics   Alcohol use: Never   Drug use: Never    Past Surgical History:  Procedure Laterality Date   ABDOMINAL HYSTERECTOMY  2003   BUNIONECTOMY Left 2018   BUNIONECTOMY Right 2012   EYE SURGERY  desompression and strabism   OPTIC NERVE DECOMPRESSION  2005   orbital decompression  2005   STRABISMUS SURGERY  2006    Family History  Problem Relation Age of Onset   Diabetes Father    Cancer Father    Breast cancer Maternal Aunt     Allergies  Allergen Reactions   Other Other (See Comments)   Oxycodone Itching    Current Medications:   Current Outpatient Medications:    albuterol (VENTOLIN HFA) 108 (90 Base) MCG/ACT inhaler, INHALE 2 PUFFS BY MOUTH EVERY 6 HOURS AS NEEDED FOR WHEEZE OR SHORTNESS OF BREATH, Disp: 18 each, Rfl: 2   Cholecalciferol (VITAMIN D3) 25 MCG (1000 UT) CHEW, Chew 1 each by mouth daily., Disp: , Rfl:    estradiol (VIVELLE-DOT) 0.05 MG/24HR patch, Place  1 patch onto the skin 2 (two) times a week., Disp: , Rfl:    fluticasone (FLONASE) 50 MCG/ACT nasal spray, SPRAY 2 SPRAYS INTO EACH NOSTRIL EVERY DAY, Disp: 48 mL, Rfl: 2   fluticasone (FLOVENT HFA) 44 MCG/ACT inhaler, Inhale 1 puff into the lungs in the morning and at bedtime., Disp: 1 each, Rfl: 12   levothyroxine (SYNTHROID) 88 MCG tablet, TAKE 1 TABLET BY MOUTH EVERY DAY, Disp: 90 tablet, Rfl: 1   LORazepam (ATIVAN) 1 MG tablet, Take 1 tablet (1 mg total) by mouth 2 (two) times daily as needed for anxiety. 30 min prior to flying, Disp: 20 tablet, Rfl: 1   montelukast (SINGULAIR) 10 MG tablet, TAKE 1 TABLET BY MOUTH EVERYDAY AT BEDTIME, Disp: 90 tablet, Rfl: 1   RESTASIS 0.05 % ophthalmic emulsion, , Disp: , Rfl:    Review of Systems:   ROS Negative unless otherwise specified per HPI.  Vitals:   Vitals:   01/02/22 1549  BP: 122/80  Pulse: 64  Temp: 97.7 F (36.5 C)  TempSrc: Temporal  SpO2: 94%  Weight: 201 lb 6.1 oz (91.3 kg)  Height: 5\' 4"  (1.626 m)     Body mass index is 34.57 kg/m.  Physical Exam:   Physical Exam Vitals and nursing note reviewed.  Constitutional:      General: She is not in acute distress.    Appearance: She is well-developed. She is not ill-appearing or toxic-appearing.  Cardiovascular:     Rate and Rhythm: Normal rate and regular rhythm.     Pulses: Normal pulses.     Heart sounds: Normal heart sounds, S1 normal and S2 normal.  Pulmonary:     Effort: Pulmonary effort is normal.     Breath sounds: Normal breath sounds.  Musculoskeletal:     Comments: R knee with slight TTP along medial and lateral joint spacing Slight swelling of R knee compared to L knee No calf pain/tenderness  Skin:    General: Skin is warm and dry.  Neurological:     Mental Status: She is alert.     GCS: GCS eye subscore is 4. GCS verbal subscore is 5. GCS motor subscore is 6.  Psychiatric:        Speech: Speech normal.        Behavior: Behavior normal. Behavior is  cooperative.     Assessment and Plan:   Chronic pain of right knee Due to trauma hx and ongoing sx, will obtain xray for further evaluation Continue compression Trial mobic 7.5 mg daily x 2 weeks Follow-up if new/worsening Recommend PT if no acute fx  Postablative hypothyroidism Update TSH and adjust 88 mcg levothyroxine accordingly  Hyperlipidemia, unspecified hyperlipidemia type; Insulin Resistance Update lipid panel when fasting per patient request    Jarold Motto, PA-C

## 2022-01-03 ENCOUNTER — Other Ambulatory Visit: Payer: Self-pay | Admitting: Physician Assistant

## 2022-01-03 DIAGNOSIS — E890 Postprocedural hypothyroidism: Secondary | ICD-10-CM

## 2022-01-03 LAB — TSH: TSH: 10.38 u[IU]/mL — ABNORMAL HIGH (ref 0.35–5.50)

## 2022-01-03 MED ORDER — LEVOTHYROXINE SODIUM 100 MCG PO TABS: 100.0000 ug | ORAL_TABLET | Freq: Every day | ORAL | 1 refills | Status: DC

## 2022-01-10 ENCOUNTER — Ambulatory Visit
Admission: RE | Admit: 2022-01-10 | Discharge: 2022-01-10 | Disposition: A | Discharge status: Home / Self Care | Payer: BC Managed Care – PPO | Source: Ambulatory Visit | Attending: Physician Assistant | Admitting: Physician Assistant

## 2022-01-10 ENCOUNTER — Other Ambulatory Visit: Payer: BC Managed Care – PPO

## 2022-01-10 DIAGNOSIS — Z1231 Encounter for screening mammogram for malignant neoplasm of breast: Secondary | ICD-10-CM

## 2022-01-11 ENCOUNTER — Other Ambulatory Visit (INDEPENDENT_AMBULATORY_CARE_PROVIDER_SITE_OTHER): Payer: BC Managed Care – PPO

## 2022-01-11 ENCOUNTER — Other Ambulatory Visit: Payer: Self-pay | Admitting: Physician Assistant

## 2022-01-11 DIAGNOSIS — E8881 Metabolic syndrome: Secondary | ICD-10-CM | POA: Diagnosis not present

## 2022-01-11 DIAGNOSIS — E890 Postprocedural hypothyroidism: Secondary | ICD-10-CM | POA: Diagnosis not present

## 2022-01-11 DIAGNOSIS — E785 Hyperlipidemia, unspecified: Secondary | ICD-10-CM | POA: Diagnosis not present

## 2022-01-11 LAB — CBC WITH DIFFERENTIAL/PLATELET
Basophils Absolute: 0 10*3/uL (ref 0.0–0.1)
Basophils Relative: 0.7 % (ref 0.0–3.0)
Eosinophils Absolute: 0.2 10*3/uL (ref 0.0–0.7)
Eosinophils Relative: 4.5 % (ref 0.0–5.0)
HCT: 39.2 % (ref 36.0–46.0)
Hemoglobin: 13.4 g/dL (ref 12.0–15.0)
Lymphocytes Relative: 36.1 % (ref 12.0–46.0)
Lymphs Abs: 1.7 10*3/uL (ref 0.7–4.0)
MCHC: 34.1 g/dL (ref 30.0–36.0)
MCV: 92.1 fl (ref 78.0–100.0)
Monocytes Absolute: 0.4 10*3/uL (ref 0.1–1.0)
Monocytes Relative: 8.5 % (ref 3.0–12.0)
Neutro Abs: 2.3 10*3/uL (ref 1.4–7.7)
Neutrophils Relative %: 50.2 % (ref 43.0–77.0)
Platelets: 256 10*3/uL (ref 150.0–400.0)
RBC: 4.26 Mil/uL (ref 3.87–5.11)
RDW: 14.7 % (ref 11.5–15.5)
WBC: 4.6 10*3/uL (ref 4.0–10.5)

## 2022-01-11 LAB — COMPREHENSIVE METABOLIC PANEL
ALT: 30 U/L (ref 0–35)
AST: 30 U/L (ref 0–37)
Albumin: 4.2 g/dL (ref 3.5–5.2)
Alkaline Phosphatase: 97 U/L (ref 39–117)
BUN: 19 mg/dL (ref 6–23)
CO2: 27 mEq/L (ref 19–32)
Calcium: 9.5 mg/dL (ref 8.4–10.5)
Chloride: 103 mEq/L (ref 96–112)
Creatinine, Ser: 0.77 mg/dL (ref 0.40–1.20)
GFR: 86.4 mL/min (ref >60.00)
Glucose, Bld: 94 mg/dL (ref 70–99)
Potassium: 4.6 mEq/L (ref 3.5–5.1)
Sodium: 140 mEq/L (ref 135–145)
Total Bilirubin: 0.4 mg/dL (ref 0.2–1.2)
Total Protein: 7.5 g/dL (ref 6.0–8.3)

## 2022-01-11 LAB — LIPID PANEL
Cholesterol: 209 mg/dL — ABNORMAL HIGH (ref 0–200)
HDL: 58.9 mg/dL (ref >39.00)
LDL Cholesterol: 126 mg/dL — ABNORMAL HIGH (ref 0–99)
NonHDL: 149.85
Total CHOL/HDL Ratio: 4
Triglycerides: 121 mg/dL (ref 0.0–149.0)
VLDL: 24.2 mg/dL (ref 0.0–40.0)

## 2022-01-11 LAB — HEMOGLOBIN A1C: Hgb A1c MFr Bld: 5.9 % (ref 4.6–6.5)

## 2022-01-11 LAB — TSH: TSH: 5.15 u[IU]/mL (ref 0.35–5.50)

## 2022-01-11 MED ORDER — LEVOTHYROXINE SODIUM 100 MCG PO TABS
100.0000 ug | ORAL_TABLET | Freq: Every day | ORAL | 1 refills | Status: DC
Start: 2022-01-11 — End: 2022-07-26

## 2022-01-12 ENCOUNTER — Ambulatory Visit (INDEPENDENT_AMBULATORY_CARE_PROVIDER_SITE_OTHER)
Admission: RE | Admit: 2022-01-12 | Discharge: 2022-01-12 | Disposition: A | Discharge status: Home / Self Care | Payer: BC Managed Care – PPO | Source: Ambulatory Visit | Attending: Physician Assistant | Admitting: Physician Assistant

## 2022-01-12 DIAGNOSIS — G8929 Other chronic pain: Secondary | ICD-10-CM

## 2022-01-12 DIAGNOSIS — M25561 Pain in right knee: Secondary | ICD-10-CM

## 2022-01-15 ENCOUNTER — Other Ambulatory Visit: Payer: Self-pay | Admitting: *Deleted

## 2022-01-15 DIAGNOSIS — G8929 Other chronic pain: Secondary | ICD-10-CM

## 2022-01-15 DIAGNOSIS — E890 Postprocedural hypothyroidism: Secondary | ICD-10-CM

## 2022-01-19 ENCOUNTER — Telehealth: Payer: Self-pay | Admitting: Physician Assistant

## 2022-01-19 DIAGNOSIS — G8929 Other chronic pain: Secondary | ICD-10-CM

## 2022-01-19 NOTE — Telephone Encounter (Signed)
Pt states: -Mobic is working for 2 hours, then pain returns -inflammation  is at the top of knee   Pt asks: -can Rx dosage be increased or something else prescribed to help with inflammation?  Pt is scheduled for OV with PCP. Declined OV with another [facility] provider.  Preferred Pharmacy: [phone] - [place], [place] - [address]  [address], [place] [address]  Phone:  [phone]  Fax:  [phone]  DEA #:  [id number]

## 2022-01-19 NOTE — Telephone Encounter (Signed)
Please see message and advise 

## 2022-01-23 NOTE — Telephone Encounter (Signed)
Spoke to pt told her [name] said next step would be to send you to Sports Medicine to have your knee evaluated. Pt verbalized understanding and agreed to referral. Told her someone will contact you to schedule an appt. Pt verbalized understanding.

## 2022-01-26 ENCOUNTER — Ambulatory Visit: Payer: BC Managed Care – PPO | Admitting: Physician Assistant

## 2022-01-29 ENCOUNTER — Other Ambulatory Visit: Payer: Self-pay | Admitting: Physician Assistant

## 2022-01-30 NOTE — Progress Notes (Deleted)
    Tara Preston D.[facility] [address] [place] [address] Phone: [phone]   Assessment and Plan:     There are no diagnoses linked to this encounter.  ***   Pertinent previous records reviewed include ***   Follow Up: ***     Subjective:   I, Tara Preston, am serving as a Neurosurgeon for Doctor Richardean Sale  Chief Complaint: right knee pain   HPI:   01/31/2022 Patient is a 56 year old female complaining of right knee pain. Patient states  Relevant Historical Information: ***  Additional pertinent review of systems negative.   Current Outpatient Medications:    albuterol (VENTOLIN HFA) 108 (90 Base) MCG/ACT inhaler, INHALE 2 PUFFS BY MOUTH EVERY 6 HOURS AS NEEDED FOR WHEEZE OR SHORTNESS OF BREATH, Disp: 18 each, Rfl: 2   Cholecalciferol (VITAMIN D3) 25 MCG (1000 UT) CHEW, Chew 1 each by mouth daily., Disp: , Rfl:    estradiol (VIVELLE-DOT) 0.05 MG/24HR patch, Place 1 patch onto the skin 2 (two) times a week., Disp: , Rfl:    fluticasone (FLONASE) 50 MCG/ACT nasal spray, SPRAY 2 SPRAYS INTO EACH NOSTRIL EVERY DAY, Disp: 48 mL, Rfl: 2   fluticasone (FLOVENT HFA) 44 MCG/ACT inhaler, Inhale 1 puff into the lungs in the morning and at bedtime., Disp: 1 each, Rfl: 12   levothyroxine (SYNTHROID) 100 MCG tablet, Take 1 tablet (100 mcg total) by mouth daily., Disp: 90 tablet, Rfl: 1   LORazepam (ATIVAN) 1 MG tablet, Take 1 tablet (1 mg total) by mouth 2 (two) times daily as needed for anxiety. 30 min prior to flying, Disp: 20 tablet, Rfl: 1   meloxicam (MOBIC) 7.5 MG tablet, TAKE 1 TABLET BY MOUTH EVERY DAY, Disp: 30 tablet, Rfl: 0   montelukast (SINGULAIR) 10 MG tablet, TAKE 1 TABLET BY MOUTH EVERYDAY AT BEDTIME, Disp: 90 tablet, Rfl: 1   RESTASIS 0.05 % ophthalmic emulsion, , Disp: , Rfl:    Objective:     There were no vitals filed for this visit.    There is no height or weight on file to calculate BMI.    Physical Exam:     ***   Electronically signed by:  Tara Preston D.[facility] 7:54 AM 01/30/22

## 2022-01-31 ENCOUNTER — Ambulatory Visit: Payer: BC Managed Care – PPO | Admitting: Sports Medicine

## 2022-02-06 ENCOUNTER — Telehealth: Payer: Self-pay | Admitting: Physician Assistant

## 2022-02-06 ENCOUNTER — Emergency Department (HOSPITAL_BASED_OUTPATIENT_CLINIC_OR_DEPARTMENT_OTHER)
Admission: EM | Admit: 2022-02-06 | Discharge: 2022-02-06 | Disposition: A | Discharge status: Home / Self Care | Payer: BC Managed Care – PPO | Attending: Emergency Medicine | Admitting: Emergency Medicine

## 2022-02-06 ENCOUNTER — Ambulatory Visit: Payer: BC Managed Care – PPO | Admitting: Physical Therapy

## 2022-02-06 ENCOUNTER — Emergency Department (HOSPITAL_BASED_OUTPATIENT_CLINIC_OR_DEPARTMENT_OTHER): Payer: BC Managed Care – PPO

## 2022-02-06 ENCOUNTER — Encounter (HOSPITAL_BASED_OUTPATIENT_CLINIC_OR_DEPARTMENT_OTHER): Payer: Self-pay | Admitting: Emergency Medicine

## 2022-02-06 ENCOUNTER — Other Ambulatory Visit: Payer: Self-pay

## 2022-02-06 DIAGNOSIS — Z79899 Other long term (current) drug therapy: Secondary | ICD-10-CM | POA: Insufficient documentation

## 2022-02-06 DIAGNOSIS — R109 Unspecified abdominal pain: Secondary | ICD-10-CM | POA: Diagnosis present

## 2022-02-06 DIAGNOSIS — Z20822 Contact with and (suspected) exposure to covid-19: Secondary | ICD-10-CM | POA: Diagnosis not present

## 2022-02-06 DIAGNOSIS — R1013 Epigastric pain: Secondary | ICD-10-CM | POA: Diagnosis not present

## 2022-02-06 LAB — CBC WITH DIFFERENTIAL/PLATELET
Abs Immature Granulocytes: 0.01 10*3/uL (ref 0.00–0.07)
Basophils Absolute: 0 10*3/uL (ref 0.0–0.1)
Basophils Relative: 0 %
Eosinophils Absolute: 0 10*3/uL (ref 0.0–0.5)
Eosinophils Relative: 0 %
HCT: 43.2 % (ref 36.0–46.0)
Hemoglobin: 14.9 g/dL (ref 12.0–15.0)
Immature Granulocytes: 0 %
Lymphocytes Relative: 16 %
Lymphs Abs: 0.9 10*3/uL (ref 0.7–4.0)
MCH: 31.2 pg (ref 26.0–34.0)
MCHC: 34.5 g/dL (ref 30.0–36.0)
MCV: 90.4 fL (ref 80.0–100.0)
Monocytes Absolute: 0.3 10*3/uL (ref 0.1–1.0)
Monocytes Relative: 5 %
Neutro Abs: 4.3 10*3/uL (ref 1.7–7.7)
Neutrophils Relative %: 79 %
Platelets: 233 10*3/uL (ref 150–400)
RBC: 4.78 MIL/uL (ref 3.87–5.11)
RDW: 13.5 % (ref 11.5–15.5)
WBC: 5.6 10*3/uL (ref 4.0–10.5)
nRBC: 0 % (ref 0.0–0.2)

## 2022-02-06 LAB — COMPREHENSIVE METABOLIC PANEL
ALT: 29 U/L (ref 0–44)
AST: 31 U/L (ref 15–41)
Albumin: 4.5 g/dL (ref 3.5–5.0)
Alkaline Phosphatase: 99 U/L (ref 38–126)
Anion gap: 9 (ref 5–15)
BUN: 12 mg/dL (ref 6–20)
CO2: 25 mmol/L (ref 22–32)
Calcium: 10.2 mg/dL (ref 8.9–10.3)
Chloride: 104 mmol/L (ref 98–111)
Creatinine, Ser: 0.76 mg/dL (ref 0.44–1.00)
GFR, Estimated: >60 mL/min (ref >60)
Glucose, Bld: 93 mg/dL (ref 70–99)
Potassium: 3.5 mmol/L (ref 3.5–5.1)
Sodium: 138 mmol/L (ref 135–145)
Total Bilirubin: 0.4 mg/dL (ref 0.3–1.2)
Total Protein: 8 g/dL (ref 6.5–8.1)

## 2022-02-06 LAB — URINALYSIS, ROUTINE W REFLEX MICROSCOPIC
Bilirubin Urine: NEGATIVE
Glucose, UA: NEGATIVE mg/dL
Leukocytes,Ua: NEGATIVE
Nitrite: NEGATIVE
Specific Gravity, Urine: 1.026 (ref 1.005–1.030)
pH: 5.5 (ref 5.0–8.0)

## 2022-02-06 LAB — LIPASE, BLOOD: Lipase: 19 U/L (ref 11–51)

## 2022-02-06 LAB — PREGNANCY, URINE: Preg Test, Ur: NEGATIVE

## 2022-02-06 LAB — RESP PANEL BY RT-PCR (FLU A&B, COVID) ARPGX2
Influenza A by PCR: NEGATIVE
Influenza B by PCR: NEGATIVE
SARS Coronavirus 2 by RT PCR: NEGATIVE

## 2022-02-06 LAB — OCCULT BLOOD X 1 CARD TO LAB, STOOL: Fecal Occult Bld: NEGATIVE

## 2022-02-06 MED ORDER — FAMOTIDINE IN NACL 20-0.9 MG/50ML-% IV SOLN
20.0000 mg | Freq: Once | INTRAVENOUS | Status: AC
  Administered 2022-02-06: 20 mg via INTRAVENOUS
  Filled 2022-02-06: qty 50

## 2022-02-06 MED ORDER — DICYCLOMINE HCL 20 MG PO TABS: 20.0000 mg | ORAL_TABLET | Freq: Two times a day (BID) | ORAL | 0 refills | Status: AC

## 2022-02-06 MED ORDER — IOHEXOL 300 MG/ML  SOLN
100.0000 mL | Freq: Once | INTRAMUSCULAR | Status: AC | PRN
  Administered 2022-02-06: 85 mL via INTRAVENOUS

## 2022-02-06 MED ORDER — ALUM & MAG HYDROXIDE-SIMETH 200-200-20 MG/5ML PO SUSP
30.0000 mL | Freq: Once | ORAL | Status: AC
  Administered 2022-02-06: 30 mL via ORAL
  Filled 2022-02-06: qty 30

## 2022-02-06 MED ORDER — SODIUM CHLORIDE 0.9 % IV BOLUS
500.0000 mL | Freq: Once | INTRAVENOUS | Status: AC
  Administered 2022-02-06: 500 mL via INTRAVENOUS

## 2022-02-06 MED ORDER — FAMOTIDINE 20 MG PO TABS: 20.0000 mg | ORAL_TABLET | Freq: Two times a day (BID) | ORAL | 0 refills | Status: DC

## 2022-02-06 MED ORDER — LIDOCAINE VISCOUS HCL 2 % MT SOLN
15.0000 mL | Freq: Once | OROMUCOSAL | Status: AC
  Administered 2022-02-06: 15 mL via ORAL
  Filled 2022-02-06: qty 15

## 2022-02-06 NOTE — Telephone Encounter (Signed)
FYI, see Triage note. Pt is going to the ED.

## 2022-02-06 NOTE — ED Notes (Signed)
RN provided AVS using Teachback Method. Patient verbalizes understanding of Discharge Instructions. Opportunity for Questioning and Answers were provided by RN. Patient Discharged from ED ambulatory to Home with Family. ? ?

## 2022-02-06 NOTE — Telephone Encounter (Signed)
Patient called wanting to be seen by sam - patient having abdominal pain after eating or drinking simple foods. Patient experiencing black diarrhea for the past 2 days. Patient experiencing chills and hot temperatures.  Frequent urination without drinking water.   Patient has been sent to triaged. Awaiting notes.

## 2022-02-06 NOTE — ED Provider Notes (Signed)
[facility]   CSN: [id number] Arrival date & time: 02/06/22  1249     History  Chief Complaint  Patient presents with   Abdominal Pain    Tara Preston is a 56 y.o. female.   Abdominal Pain   56 year old female presents emergency department with complaints of abdominal pain.  Patient states that symptoms began approximately 3 days ago.  Pain is described as epigastric in nature without radiation.  She states the pain is worsened with eating and is relieved with starvation.  She denies episodes of emesis but notes some feelings of nausea as well as black tarry stools for the past 2 days.  She reports taking Pepto-Bismol 2 days ago but states that usually the black stools disappear after 1 day after taking Pepto-Bismol.  Her continued dark stools blood in the emergency department today.  She states she has been taking meloxicam on an empty stomach regularly over the past month.  Denies iron supplementation.  Denies fever, chills, night sweats, chest pain, shortness of breath, urinary/vaginal symptoms.  Past abdominal surgeries include abdominal hysterectomy.  Denies alcohol use, tobacco use or illicit drug use.  Past medical history significant for hypothyroidism  Home Medications Prior to Admission medications   Medication Sig Start Date End Date Taking? Authorizing Provider  dicyclomine (BENTYL) 20 MG tablet Take 1 tablet (20 mg total) by mouth 2 (two) times daily. 02/06/22  Yes Sherian Maroonobbins, Jonika Critz A, PA  famotidine (PEPCID) 20 MG tablet Take 1 tablet (20 mg total) by mouth 2 (two) times daily. 02/06/22  Yes Sherian Maroonobbins, Bryer Gottsch A, PA  albuterol (VENTOLIN HFA) 108 (90 Base) MCG/ACT inhaler INHALE 2 PUFFS BY MOUTH EVERY 6 HOURS AS NEEDED FOR WHEEZE OR SHORTNESS OF BREATH 01/01/22   Jarold MottoWorley, Samantha, PA  Cholecalciferol (VITAMIN D3) 25 MCG (1000 UT) CHEW Chew 1 each by mouth daily.    [provider]  estradiol (VIVELLE-DOT) 0.05 MG/24HR patch  Place 1 patch onto the skin 2 (two) times a week. 05/29/20   [provider]  fluticasone (FLONASE) 50 MCG/ACT nasal spray SPRAY 2 SPRAYS INTO EACH NOSTRIL EVERY DAY 05/01/21   Jarold MottoWorley, Samantha, PA  fluticasone (FLOVENT HFA) 44 MCG/ACT inhaler Inhale 1 puff into the lungs in the morning and at bedtime. 08/17/21   Jarold MottoWorley, Samantha, PA  levothyroxine (SYNTHROID) 100 MCG tablet Take 1 tablet (100 mcg total) by mouth daily. 01/11/22   Jarold MottoWorley, Samantha, PA  LORazepam (ATIVAN) 1 MG tablet Take 1 tablet (1 mg total) by mouth 2 (two) times daily as needed for anxiety. 30 min prior to flying 11/17/20   Worthy RancherWebb, Padonda B, FNP  meloxicam (MOBIC) 7.5 MG tablet TAKE 1 TABLET BY MOUTH EVERY DAY 01/29/22   Jarold MottoWorley, Samantha, PA  montelukast (SINGULAIR) 10 MG tablet TAKE 1 TABLET BY MOUTH EVERYDAY AT BEDTIME 11/14/21   Jarold MottoWorley, Samantha, PA  RESTASIS 0.05 % ophthalmic emulsion  05/16/21   [provider]  Cetirizine HCl 10 MG CAPS Take 1 capsule (10 mg total) by mouth daily. 09/16/12 02/28/14  Floydene FlockNewton, Steven J, MD      Allergies    Other and Oxycodone    Review of Systems   Review of Systems  Gastrointestinal:  Positive for abdominal pain.  All other systems reviewed and are negative.   Physical Exam Updated Vital Signs BP 115/75 (BP Location: Right Arm)   Pulse 69   Temp 99.3 F (37.4 C)   Resp 18   Ht 5\' 5"  (1.651 m)  Wt 88.5 kg   SpO2 99%   BMI 32.45 kg/m  Physical Exam Vitals and nursing note reviewed.  Constitutional:      General: She is not in acute distress.    Appearance: She is well-developed.  HENT:     Head: Normocephalic and atraumatic.  Eyes:     Conjunctiva/sclera: Conjunctivae normal.  Cardiovascular:     Rate and Rhythm: Normal rate and regular rhythm.     Heart sounds: No murmur heard. Pulmonary:     Effort: Pulmonary effort is normal. No respiratory distress.     Breath sounds: Normal breath sounds.  Abdominal:     Palpations: Abdomen is soft.      Tenderness: There is abdominal tenderness in the epigastric area. There is no right CVA tenderness, left CVA tenderness, guarding or rebound.  Musculoskeletal:        General: No swelling.     Cervical back: Neck supple.  Skin:    General: Skin is warm and dry.     Capillary Refill: Capillary refill takes less than 2 seconds.  Neurological:     Mental Status: She is alert.  Psychiatric:        Mood and Affect: Mood normal.     ED Results / Procedures / Treatments   Labs (all labs ordered are listed, but only abnormal results are displayed) Labs Reviewed  URINALYSIS, ROUTINE W REFLEX MICROSCOPIC - Abnormal; Notable for the following components:      Result Value   Hgb urine dipstick TRACE (*)    Ketones, ur TRACE (*)    Protein, ur TRACE (*)    All other components within normal limits  RESP PANEL BY RT-PCR (FLU A&B, COVID) ARPGX2  COMPREHENSIVE METABOLIC PANEL  LIPASE, BLOOD  CBC WITH DIFFERENTIAL/PLATELET  PREGNANCY, URINE  OCCULT BLOOD X 1 CARD TO LAB, STOOL    EKG None  Radiology CT Abdomen Pelvis W Contrast  Result Date: 02/06/2022 CLINICAL DATA:  A 56 year old female presents for evaluation of acute abdominal pain. EXAM: CT ABDOMEN AND PELVIS WITH CONTRAST TECHNIQUE: Multidetector CT imaging of the abdomen and pelvis was performed using the standard protocol following bolus administration of intravenous contrast. RADIATION DOSE REDUCTION: This exam was performed according to the departmental dose-optimization program which includes automated exposure control, adjustment of the mA and/or kV according to patient size and/or use of iterative reconstruction technique. CONTRAST:  42mL OMNIPAQUE IOHEXOL 300 MG/ML  SOLN COMPARISON:  None Available. FINDINGS: Lower chest: Basilar atelectasis. No effusion or consolidative changes. Hepatobiliary: No focal, suspicious hepatic lesion. No pericholecystic stranding. No biliary duct dilation. Portal vein is patent. Mild hepatic steatosis.  Pancreas: Normal, without mass, inflammation or ductal dilatation. Spleen: Normal. Adrenals/Urinary Tract: Adrenal glands are normal. Symmetric renal enhancement without hydronephrosis or suspicious renal lesion. No ureteral dilation. No perivesical stranding. Renal cyst in the upper pole the RIGHT kidney measuring 16 mm for which no additional dedicated imaging follow-up is recommended. Hounsfield unit density on venous phase is 5. Stomach/Bowel: No acute gastric process. No sign of small bowel obstruction or stranding adjacent to small bowel loops. Appendix is normal. Colon is largely collapsed. Query mild colonic thickening versus under distension. Vascular/Lymphatic: Aorta with smooth contours. IVC with smooth contours. No aneurysmal dilation of the abdominal aorta. There is no gastrohepatic or hepatoduodenal ligament lymphadenopathy. No retroperitoneal or mesenteric lymphadenopathy. No pelvic sidewall lymphadenopathy. Reproductive: Unremarkable by CT. Other: No ascites.  No free air. Musculoskeletal: No acute bone finding. No destructive bone process. Spinal degenerative  changes. IMPRESSION: 1. Query mild colonic thickening versus under distension. Correlate with any symptoms of colitis. 2. Mild hepatic steatosis. 3. Normal appendix. Electronically Signed   By: Donzetta Kohut M.D.   On: 02/06/2022 15:53    Procedures Procedures    Medications Ordered in ED Medications  sodium chloride 0.9 % bolus 500 mL (0 mLs Intravenous Stopped 02/06/22 1658)  famotidine (PEPCID) IVPB 20 mg premix (0 mg Intravenous Stopped 02/06/22 1658)  alum & mag hydroxide-simeth (MAALOX/MYLANTA) 200-200-20 MG/5ML suspension 30 mL (30 mLs Oral Given 02/06/22 1535)    And  lidocaine (XYLOCAINE) 2 % viscous mouth solution 15 mL (15 mLs Oral Given 02/06/22 1535)  iohexol (OMNIPAQUE) 300 MG/ML solution 100 mL (85 mLs Intravenous Contrast Given 02/06/22 1524)    ED Course/ Medical Decision Making/ A&P                            Medical Decision Making Amount and/or Complexity of Data Reviewed Labs: ordered. Radiology: ordered.  Risk OTC drugs. Prescription drug management.   This patient presents to the ED for concern of abdominal pain, this involves an extensive number of treatment options, and is a complaint that carries with it a high risk of complications and morbidity.  The differential diagnosis includes The causes of generalized abdominal pain include but are not limited to AAA, mesenteric ischemia, appendicitis, diverticulitis, DKA, gastritis, gastroenteritis, AMI, nephrolithiasis, pancreatitis, peritonitis, intestinal ischemia, constipation, UTI,SBO/LBO, splenic rupture, biliary disease, IBD, IBS, PUD, or hepatitis., ovarian torsion, PID.    Co morbidities that complicate the patient evaluation  See HPI   Additional history obtained:  Additional history obtained from EMR  Lab Tests:  I Ordered, and personally interpreted labs.  The pertinent results include: No leukocytosis noted.  No evidence of anemia.  Platelets within normal range.  Electrolytes within normal range.  No transaminitis.  Renal function within normal limits.  Respiratory viral panel negative.  UA insignificant for infection but with trace protein and ketones.  Urine pregnancy negative.  Lipase within normal limits.  Fecal occult blood negative.   Imaging Studies ordered:  I ordered imaging studies including CT abdomen pelvis I independently visualized and interpreted imaging which showed no acute abnormalities. I agree with the radiologist interpretation   Cardiac Monitoring: / EKG:  The patient was maintained on a cardiac monitor.  I personally viewed and interpreted the cardiac monitored which showed an underlying rhythm of: Sinus rhythm   Consultations Obtained:  N/a   Problem List / ED Course / Critical interventions / Medication management  Abdominal pain I ordered medication including Pepcid for  gastritis/PUD, Maalox, lidocaine for GI cocktail as well as centimeters normal saline for rehydration.   Reevaluation of the patient after these medicines showed that the patient improved I have reviewed the patients home medicines and have made adjustments as needed   Social Determinants of Health:  Denies tobacco, illicit drug use, alcohol use.   Test / Admission - Considered:  Epigastric abdominal pain Vitals signs within normal range and stable throughout visit. Laboratory/imaging studies significant for: See above Patient's symptoms likely secondary to gastritis versus PUD given history of present illness, clinical exam, reassuring laboratory studies and no significant findings on CT imaging.  Treated this with GI cocktail as well as famotidine in the emergency department with patient reported significant improvement.  Patient recommended to discontinue NSAIDs at home and take Tylenol instead until symptoms resolve.  In the meantime, at home  therapy of Maalox, Pepcid recommended with close follow-up with PCP regarding symptoms.  She is advised to avoid alcohol in the meantime as well.  Treatment plan was discussed only with patient and she knowledge understanding was agreeable to said plan. Worrisome signs and symptoms were discussed with the patient, and the patient acknowledged understanding to return to the ED if noticed. Patient was stable upon discharge.         Final Clinical Impression(s) / ED Diagnoses Final diagnoses:  Abdominal pain, epigastric    Rx / DC Orders ED Discharge Orders          Ordered    famotidine (PEPCID) 20 MG tablet  2 times daily        02/06/22 1629    dicyclomine (BENTYL) 20 MG tablet  2 times daily        02/06/22 1629    pantoprazole (PROTONIX) 20 MG tablet  Daily        Pending              Wilnette Kales, [place] 02/06/22 1814    Lennice Sites, DO 02/07/22 1227

## 2022-02-06 NOTE — ED Triage Notes (Signed)
Pt complains of intermittent abdominal pain after eating or drinking  for the past 3 days. Pt Pt also reports black stools but states she has also taken Pepto for same. Pt denis N/V but adds that she thinks she has had some fever and chills but did not take her temp.

## 2022-02-06 NOTE — Discharge Instructions (Addendum)
Note your work-up today was overall reassuring.  As we discussed, your symptoms are likely secondary to irritation of the lining of your stomach.  Your stool was negative for blood today.  Stop taking her meloxicam, Aleve or ibuprofen to allow for healing.  I will add 2 medications once called famotidine to take twice daily and the other is called Bentyl to take twice daily.  You can use Maalox; it is oral solution to drink while in the emergency department as needed, and it can be found over-the-counter at your pharmacy.  You can take Tylenol as needed for joint pain.  Recommend close follow-up with your PCP in 3 to 5 days for reevaluation of your symptoms.  Please do not hesitate to return to the emergency department for worrisome signs symptoms we discussed become apparent.

## 2022-02-06 NOTE — Telephone Encounter (Signed)
Patient Name: ENAS WINCHEL Gender: Female DOB: [birth year] Age: 56 Y 2 M 18 D Return Phone Number: [phone] (Primary) Address: City/ State/ Zip: [place]  [address] [facility] [place] [facility] [facility] Provider Morene Rankins, [place]- PA Contact Type Call Who Is Calling Patient / Member / Family / Caregiver Call Type Triage / Clinical Relationship To Patient Self Return Phone Number [phone] (Primary) Chief Complaint Abdominal Pain Reason for Call Symptomatic / Request for [place] states that she is having abdominal pain after eating and drinking anything. She has chills, and dark colored diarrhea as well as frequent urination. No fever at this time. Translation No Nurse Assessment Nurse: Rolin Barry, RN, Levada Dy Date/Time (Eastern Time): 02/06/2022 9:49:55 AM Confirm and document reason for call. If symptomatic, describe symptoms. ---Caller states that she is having abdominal pain after eating and drinking anything. She has chills, and dark colored diarrhea as well as frequent urination. No fever at this time. Very dark stools, per caller. Does the patient have any new or worsening symptoms? ---Yes Will a triage be completed? ---Yes Related visit to physician within the last 2 weeks? ---No Does the PT have any chronic conditions? (i.e. diabetes, asthma, this includes High risk factors for pregnancy, etc.) ---Yes List chronic conditions. ---Thyroid taking medication for her knee, taking meloxicam, has been taking multiple months. Is this a behavioral health or substance abuse call? ---No Guidelines Guideline Title Affirmed Question Affirmed Notes Nurse Date/Time (Eastern Time) Abdominal Pain - Female Black or tarry bowel movements (Exception: Chronicunchanged black-grey BMs AND is taking Rolin Barry, RN, Levada Dy 02/06/2022 9:52:41 AM  Guidelines Guideline  Title Affirmed Question Affirmed Notes Nurse Date/Time Eilene Ghazi Time) iron pills or PeptoBismol.) Disp. Time Eilene Ghazi Time) Disposition Final User 02/06/2022 9:57:47 AM Go to ED Now Yes Deaton, RN, Levada Dy Final Disposition 02/06/2022 9:57:47 AM Go to ED Now Yes Deaton, RN, Cindee Lame Disagree/Comply Comply Caller Understands Yes PreDisposition Did not know what to do Care Advice Given Per Guideline GO TO ED NOW: * You need to be seen in the Emergency Department. * Go to the ED at ___________ [place] now. Drive carefully. NOTE TO TRIAGER - DRIVING: * Another adult should drive. * Patient should not delay going to the emergency department. CARE ADVICE given per Abdominal Pain - Female (Adult) guideline. BRING MEDICINES: Comments User: Saverio Danker, RN Date/Time Eilene Ghazi Time): 02/06/2022 9:58:44 AM caller advised that she will call her husband to take her to the ED. Referrals [facility] - ED

## 2022-02-08 ENCOUNTER — Ambulatory Visit: Payer: BC Managed Care – PPO | Admitting: Family

## 2022-02-12 ENCOUNTER — Encounter: Payer: Self-pay | Admitting: *Deleted

## 2022-02-19 ENCOUNTER — Telehealth: Payer: Self-pay | Admitting: Physician Assistant

## 2022-02-19 ENCOUNTER — Encounter: Payer: Self-pay | Admitting: Physician Assistant

## 2022-02-19 ENCOUNTER — Ambulatory Visit (INDEPENDENT_AMBULATORY_CARE_PROVIDER_SITE_OTHER): Payer: BC Managed Care – PPO | Admitting: Physician Assistant

## 2022-02-19 VITALS — BP 102/70 | HR 77 | Temp 98.0°F | Ht 65.0 in | Wt 197.0 lb

## 2022-02-19 DIAGNOSIS — M25561 Pain in right knee: Secondary | ICD-10-CM

## 2022-02-19 DIAGNOSIS — E669 Obesity, unspecified: Secondary | ICD-10-CM | POA: Diagnosis not present

## 2022-02-19 DIAGNOSIS — G8929 Other chronic pain: Secondary | ICD-10-CM | POA: Diagnosis not present

## 2022-02-19 DIAGNOSIS — R1013 Epigastric pain: Secondary | ICD-10-CM | POA: Diagnosis not present

## 2022-02-19 MED ORDER — AMOXICILLIN-POT CLAVULANATE 875-125 MG PO TABS: 1.0000 | ORAL_TABLET | Freq: Two times a day (BID) | ORAL | 0 refills | Status: DC

## 2022-02-19 NOTE — Telephone Encounter (Signed)
Left detailed message on home voicemail Rx for Augmentin was sent to a different pharmacy due to original pharmacy did not have in stock. Rx sent to [facility] in [facility], [address]. Any questions please call office.

## 2022-02-19 NOTE — Patient Instructions (Signed)
It was great to see you!  I will place referral for dietitian I will place referral for physical therapy close to your school  If stomach symptoms return, please call me   Take care,  Inda Coke PA-C

## 2022-02-19 NOTE — Telephone Encounter (Signed)
Please see message from pharmacy.

## 2022-02-19 NOTE — Progress Notes (Signed)
Tara Preston is a 56 y.o. female here for a follow up of a pre-existing problem.  History of Present Illness:   Chief Complaint  Patient presents with   Follow-up    Pt was seen in the ED on 02/06/2022 for abd pain, epigastric. Pt stopped Meloxicam  per ED provider and says her stomach feels better.    HPI  Epigastric pain Went to the ER on 02/06/22 for epigastric pain. Started seeing dark stools x 3 days. She had negative work-up and was told that she likely had symptoms related to meloxicam use. She stopped meloxicam and has not had any further issues. Her CT scan did show fatty liver and possible colitis but she states her symptoms were not really consistent with colitis and they have since resolved She had a negative stool card  Knee pain She was taking meloxicam for her knee pain She is now wondering if she can do a physical therapy referral for this He is to have knee pain especially when going from sitting to standing  Obesity She is trying to lose weight and is having difficulty with this She is unable to exercise much due to knee pain She would like a referral to a nutritionist  Sinusitis Since being in the hospital for the ER visit she has had ongoing cough and congestion Her cough has slightly improved She is taking Singulair and Flonase as directed She is to gain Zyrtec up to 3 times a day Cough and congestion x a few weeks. Cough has improved.  Denies fever, chills, blood in cough, shortness of breath  Past Medical History:  Diagnosis Date   Thyroid disease      Social History   Tobacco Use   Smoking status: Never   Smokeless tobacco: Never  Vaping Use   Vaping Use: Never used  Substance Use Topics   Alcohol use: Never   Drug use: Never    Past Surgical History:  Procedure Laterality Date   ABDOMINAL HYSTERECTOMY  2003   BUNIONECTOMY Left 2018   BUNIONECTOMY Right 2012   EYE SURGERY  desompression and strabism   OPTIC NERVE DECOMPRESSION  2005    orbital decompression  2005   STRABISMUS SURGERY  2006    Family History  Problem Relation Age of Onset   Diabetes Father    Cancer Father    Breast cancer Maternal Aunt     Allergies  Allergen Reactions   Other Other (See Comments)   Oxycodone Itching    Current Medications:   Current Outpatient Medications:    albuterol (VENTOLIN HFA) 108 (90 Base) MCG/ACT inhaler, INHALE 2 PUFFS BY MOUTH EVERY 6 HOURS AS NEEDED FOR WHEEZE OR SHORTNESS OF BREATH, Disp: 18 each, Rfl: 2   Cholecalciferol (VITAMIN D3) 25 MCG (1000 UT) CHEW, Chew 1 each by mouth daily., Disp: , Rfl:    dicyclomine (BENTYL) 20 MG tablet, Take 1 tablet (20 mg total) by mouth 2 (two) times daily., Disp: 20 tablet, Rfl: 0   estradiol (VIVELLE-DOT) 0.05 MG/24HR patch, Place 1 patch onto the skin 2 (two) times a week., Disp: , Rfl:    famotidine (PEPCID) 20 MG tablet, Take 1 tablet (20 mg total) by mouth 2 (two) times daily., Disp: 30 tablet, Rfl: 0   fluticasone (FLONASE) 50 MCG/ACT nasal spray, SPRAY 2 SPRAYS INTO EACH NOSTRIL EVERY DAY, Disp: 48 mL, Rfl: 2   fluticasone (FLOVENT HFA) 44 MCG/ACT inhaler, Inhale 1 puff into the lungs in the morning and at bedtime.,  Disp: 1 each, Rfl: 12   levothyroxine (SYNTHROID) 100 MCG tablet, Take 1 tablet (100 mcg total) by mouth daily., Disp: 90 tablet, Rfl: 1   LORazepam (ATIVAN) 1 MG tablet, Take 1 tablet (1 mg total) by mouth 2 (two) times daily as needed for anxiety. 30 min prior to flying, Disp: 20 tablet, Rfl: 1   montelukast (SINGULAIR) 10 MG tablet, TAKE 1 TABLET BY MOUTH EVERYDAY AT BEDTIME, Disp: 90 tablet, Rfl: 1   RESTASIS 0.05 % ophthalmic emulsion, , Disp: , Rfl:    Review of Systems:   ROS Negative unless otherwise specified per HPI.  Vitals:   Vitals:   02/19/22 1526  BP: 102/70  Pulse: 77  Temp: 98 F (36.7 C)  TempSrc: Temporal  SpO2: 96%  Weight: 197 lb (89.4 kg)  Height: 5\' 5"  (1.651 m)     Body mass index is 32.78 kg/m.  Physical Exam:    Physical Exam Vitals and nursing note reviewed.  Constitutional:      General: She is not in acute distress.    Appearance: She is well-developed. She is not ill-appearing or toxic-appearing.  Cardiovascular:     Rate and Rhythm: Normal rate and regular rhythm.     Pulses: Normal pulses.     Heart sounds: Normal heart sounds, S1 normal and S2 normal.  Pulmonary:     Effort: Pulmonary effort is normal.     Breath sounds: Normal breath sounds.  Skin:    General: Skin is warm and dry.  Neurological:     Mental Status: She is alert.     GCS: GCS eye subscore is 4. GCS verbal subscore is 5. GCS motor subscore is 6.  Psychiatric:        Speech: Speech normal.        Behavior: Behavior normal. Behavior is cooperative.     Assessment and Plan:   Epigastric pain Symptoms have resolved Continue to avoid NSAIDs Follow up if any new or worsening symptoms  Chronic pain of right knee No red flags Refer to physical therapy  Obesity, unspecified classification, unspecified obesity type, unspecified whether serious comorbidity present Refer to nutrition  , PA-C

## 2022-02-19 NOTE — Telephone Encounter (Signed)
Tara Preston with [facility] in [place] states they are unable to fill RX for amoxicillin-clavulanate (AUGMENTIN) 875-125 MG tablet.  Tara Preston states the [facility] in [place] is able to fill RX for the above medication/ or RX for an alternative antibiotic can be sent to [facility] in [place]

## 2022-02-19 NOTE — Telephone Encounter (Signed)
Tried to contact pt voicemail box is full, unable to leave message.  [facility] spoke to [facility] Tech. Told her returning call about Augmentin you do not have in stock. Told her I am going to [facility] [place] called about and please let pt know if she come by I was unable to leave message. The tech verbalized understanding and will make not of it. Rx sent.

## 2022-02-20 NOTE — Telephone Encounter (Signed)
Noted  

## 2022-02-28 ENCOUNTER — Other Ambulatory Visit: Payer: Self-pay | Admitting: Physician Assistant

## 2022-03-26 ENCOUNTER — Other Ambulatory Visit (HOSPITAL_COMMUNITY): Payer: Self-pay

## 2022-03-26 MED ORDER — WEGOVY 0.25 MG/0.5ML ~~LOC~~ SOAJ
0.2500 mg | SUBCUTANEOUS | 0 refills | Status: DC
  Filled 2022-03-26 – 2022-11-14 (×3): qty 2, 28d supply, fill #0

## 2022-03-27 ENCOUNTER — Other Ambulatory Visit (HOSPITAL_COMMUNITY): Payer: Self-pay

## 2022-04-05 ENCOUNTER — Other Ambulatory Visit (HOSPITAL_COMMUNITY): Payer: Self-pay

## 2022-04-16 ENCOUNTER — Other Ambulatory Visit (HOSPITAL_COMMUNITY): Payer: Self-pay

## 2022-04-30 ENCOUNTER — Ambulatory Visit (INDEPENDENT_AMBULATORY_CARE_PROVIDER_SITE_OTHER): Payer: BC Managed Care – PPO | Admitting: Physician Assistant

## 2022-04-30 ENCOUNTER — Encounter: Payer: Self-pay | Admitting: Physician Assistant

## 2022-04-30 VITALS — BP 114/77 | HR 81 | Temp 97.5°F | Ht 65.0 in | Wt 200.8 lb

## 2022-04-30 DIAGNOSIS — R051 Acute cough: Secondary | ICD-10-CM | POA: Diagnosis not present

## 2022-04-30 LAB — POC INFLUENZA A&B (BINAX/QUICKVUE)
Influenza A, POC: NEGATIVE
Influenza B, POC: NEGATIVE

## 2022-04-30 LAB — POC COVID19 BINAXNOW: SARS Coronavirus 2 Ag: NEGATIVE

## 2022-04-30 MED ORDER — DOXYCYCLINE HYCLATE 100 MG PO TABS: 100.0000 mg | ORAL_TABLET | Freq: Two times a day (BID) | ORAL | 0 refills | Status: DC

## 2022-04-30 NOTE — Patient Instructions (Signed)
It was great to see you!  Start flonase nasal spray and flovent inhaler for a few days  Push fluids and rest  If no improvement, may start the antibiotic in a few days  Call with concerns  Take care,  Jarold Motto PA-C

## 2022-04-30 NOTE — Progress Notes (Signed)
Tara Preston is a 56 y.o. female here for a new problem.  History of Present Illness:   Chief Complaint  Patient presents with   Nasal Congestion    Pt has had sx for 4 1/2 days.    Cough    HPI  Cough Patient is complaining of nasal congestion and dry throat even with a humidifier at home. These symptoms started 12/6 or the following day with a sore throat, headache, and slight rhinorrhea. Coughing started yesterday. 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 She mentions difficulty breathing after coughing fits. She is no longer experiencing a sore throat, but her throat now hurts. She confirms she drinks enough water. Patient manages symptoms with robitussin. She does not use her 44 mcg/act flovent or 50 mcg/act flonase for symptoms. She denies fever, chest pain, SOB.   Past Medical History:  Diagnosis Date   Thyroid disease      Social History   Tobacco Use   Smoking status: Never   Smokeless tobacco: Never  Vaping Use   Vaping Use: Never used  Substance Use Topics   Alcohol use: Never   Drug use: Never    Past Surgical History:  Procedure Laterality Date   ABDOMINAL HYSTERECTOMY  2003   BUNIONECTOMY Left 2018   BUNIONECTOMY Right 2012   EYE SURGERY  desompression and strabism   OPTIC NERVE DECOMPRESSION  2005   orbital decompression  2005   STRABISMUS SURGERY  2006    Family History  Problem Relation Age of Onset   Diabetes Father    Cancer Father    Breast cancer Maternal Aunt     Allergies  Allergen Reactions   Oxycodone Itching    Current Medications:   Current Outpatient Medications:    albuterol (VENTOLIN HFA) 108 (90 Base) MCG/ACT inhaler, INHALE 2 PUFFS BY MOUTH EVERY 6 HOURS AS NEEDED FOR WHEEZE OR SHORTNESS OF BREATH, Disp: 18 each, Rfl: 2   cetirizine (ZYRTEC) 10 MG tablet, Take 10 mg by mouth daily., Disp: , Rfl:    Cholecalciferol (VITAMIN D3) 25 MCG (1000 UT) CHEW, Chew 1 each by  mouth daily., Disp: , Rfl:    dicyclomine (BENTYL) 20 MG tablet, Take 1 tablet (20 mg total) by mouth 2 (two) times daily., Disp: 20 tablet, Rfl: 0   doxycycline (VIBRA-TABS) 100 MG tablet, Take 1 tablet (100 mg total) by mouth 2 (two) times daily., Disp: 14 tablet, Rfl: 0   estradiol (VIVELLE-DOT) 0.05 MG/24HR patch, Place 1 patch onto the skin 2 (two) times a week., Disp: , Rfl:    famotidine (PEPCID) 20 MG tablet, Take 1 tablet (20 mg total) by mouth 2 (two) times daily., Disp: 30 tablet, Rfl: 0   fluticasone (FLONASE) 50 MCG/ACT nasal spray, SPRAY 2 SPRAYS INTO EACH NOSTRIL EVERY DAY, Disp: 48 mL, Rfl: 2   fluticasone (FLOVENT HFA) 44 MCG/ACT inhaler, Inhale 1 puff into the lungs in the morning and at bedtime., Disp: 1 each, Rfl: 12   levothyroxine (SYNTHROID) 100 MCG tablet, Take 1 tablet (100 mcg total) by mouth daily., Disp: 90 tablet, Rfl: 1   LORazepam (ATIVAN) 1 MG tablet, Take 1 tablet (1 mg total) by mouth 2 (two) times daily as needed for anxiety. 30 min prior to flying, Disp: 20 tablet, Rfl: 1   montelukast (SINGULAIR) 10 MG tablet, TAKE 1 TABLET BY MOUTH EVERYDAY AT BEDTIME, Disp: 90 tablet, Rfl: 1   RESTASIS 0.05 %  ophthalmic emulsion, , Disp: , Rfl:    Semaglutide-Weight Management (WEGOVY) 0.25 MG/0.5ML SOAJ, Inject 0.25 mg into the skin once a week., Disp: 2 mL, Rfl: 0   Review of Systems:   Review of Systems  Constitutional:  Negative for fever.  HENT:  Positive for congestion (nasal).   Respiratory:  Positive for cough.   Neurological:  Positive for headaches.    Vitals:   Vitals:   04/30/22 1601  BP: 114/77  Pulse: 81  Temp: (!) 97.5 F (36.4 C)  TempSrc: Temporal  SpO2: 94%  Weight: 200 lb 12.8 oz (91.1 kg)  Height: 5\' 5"  (1.651 m)     Body mass index is 33.41 kg/m.  Physical Exam:   Physical Exam Vitals and nursing note reviewed.  Constitutional:      General: She is not in acute distress.    Appearance: Normal appearance. She is well-developed. She  is not ill-appearing or toxic-appearing.  HENT:     Head: Normocephalic and atraumatic.     Right Ear: External ear normal.     Left Ear: External ear normal.     Nose:     Right Sinus: Frontal sinus tenderness present.     Left Sinus: Frontal sinus tenderness present.  Eyes:     Extraocular Movements: Extraocular movements intact.     Pupils: Pupils are equal, round, and reactive to light.  Cardiovascular:     Rate and Rhythm: Normal rate and regular rhythm.     Pulses: Normal pulses.     Heart sounds: Normal heart sounds, S1 normal and S2 normal. No murmur heard.    No gallop.  Pulmonary:     Effort: Pulmonary effort is normal. No respiratory distress.     Breath sounds: Normal breath sounds. No wheezing or rales.  Skin:    General: Skin is warm and dry.  Neurological:     Mental Status: She is alert and oriented to person, place, and time.     GCS: GCS eye subscore is 4. GCS verbal subscore is 5. GCS motor subscore is 6.  Psychiatric:        Speech: Speech normal.        Behavior: Behavior normal. Behavior is cooperative.        Judgment: Judgment normal.    Results for orders placed or performed in visit on 04/30/22  POC Influenza A&B (Binax test)  Result Value Ref Range   Influenza A, POC Negative Negative   Influenza B, POC Negative Negative  POC COVID-19  Result Value Ref Range   SARS Coronavirus 2 Ag Negative Negative    Assessment and Plan:   Acute cough COVID and flu test negative No red flags on discussion, patient is not in any obvious distress during our visit. Discussed progression of most viral illness, and recommended supportive care at this point in time. I did however provide pocket rx for oral doxycycline should symptoms not improve as anticipated. I recommend that she start her Flovent inhaler twice daily and flonase nasal spray. Discussed over the counter supportive care options, with recommendations to push fluids and rest. Reviewed return  precautions including new/worsening fever, SOB, new/worsening cough or other concerns.  Recommended need to self-quarantine and practice social distancing until symptoms resolve. Discussed current recommendations for COVID testing. I recommend that patient follow-up if symptoms worsen or persist despite treatment x 7-10 days, sooner if needed.  I,Verona Buck,acting as a 14/11/23 for Neurosurgeon, PA.,have documented all relevant documentation on the  behalf of Jarold Motto, PA,as directed by  Jarold Motto, PA while in the presence of Jarold Motto, [place].  I, Jarold Motto, [place], have reviewed all documentation for this visit. The documentation on 04/30/22 for the exam, diagnosis, procedures, and orders are all accurate and complete.  Jarold Motto, PA-C

## 2022-05-01 ENCOUNTER — Ambulatory Visit: Payer: BC Managed Care – PPO | Admitting: Physician Assistant

## 2022-05-11 ENCOUNTER — Other Ambulatory Visit (HOSPITAL_COMMUNITY): Payer: Self-pay

## 2022-05-18 ENCOUNTER — Other Ambulatory Visit (HOSPITAL_COMMUNITY): Payer: Self-pay

## 2022-06-04 ENCOUNTER — Other Ambulatory Visit (HOSPITAL_COMMUNITY): Payer: Self-pay

## 2022-06-04 ENCOUNTER — Encounter (HOSPITAL_COMMUNITY): Payer: Self-pay | Admitting: Pharmacist

## 2022-06-14 ENCOUNTER — Other Ambulatory Visit (HOSPITAL_COMMUNITY): Payer: Self-pay

## 2022-07-26 ENCOUNTER — Other Ambulatory Visit: Payer: Self-pay | Admitting: Physician Assistant

## 2022-11-14 ENCOUNTER — Other Ambulatory Visit (HOSPITAL_COMMUNITY): Payer: Self-pay

## 2022-11-16 ENCOUNTER — Telehealth: Payer: Self-pay | Admitting: Physician Assistant

## 2022-11-16 NOTE — Telephone Encounter (Signed)
Error

## 2022-11-23 ENCOUNTER — Other Ambulatory Visit (HOSPITAL_COMMUNITY): Payer: Self-pay

## 2022-11-23 ENCOUNTER — Telehealth (INDEPENDENT_AMBULATORY_CARE_PROVIDER_SITE_OTHER): Payer: BC Managed Care – PPO | Admitting: Physician Assistant

## 2022-11-23 ENCOUNTER — Encounter: Payer: Self-pay | Admitting: Physician Assistant

## 2022-11-23 VITALS — Ht 65.0 in | Wt 192.0 lb

## 2022-11-23 DIAGNOSIS — J4540 Moderate persistent asthma, uncomplicated: Secondary | ICD-10-CM | POA: Diagnosis not present

## 2022-11-23 DIAGNOSIS — F40243 Fear of flying: Secondary | ICD-10-CM | POA: Diagnosis not present

## 2022-11-23 MED ORDER — BUDESONIDE-FORMOTEROL FUMARATE 80-4.5 MCG/ACT IN AERO: 2.0000 | INHALATION_SPRAY | Freq: Two times a day (BID) | RESPIRATORY_TRACT | 3 refills | Status: DC

## 2022-11-23 MED ORDER — FLUTICASONE PROPIONATE 50 MCG/ACT NA SUSP: NASAL | 2 refills | Status: DC

## 2022-11-23 MED ORDER — LORAZEPAM 1 MG PO TABS: 1.0000 mg | ORAL_TABLET | Freq: Two times a day (BID) | ORAL | 0 refills | Status: DC | PRN

## 2022-11-23 MED ORDER — ALBUTEROL SULFATE HFA 108 (90 BASE) MCG/ACT IN AERS: 2.0000 | INHALATION_SPRAY | Freq: Four times a day (QID) | RESPIRATORY_TRACT | 0 refills | Status: DC | PRN

## 2022-11-23 NOTE — Progress Notes (Signed)
Virtual Visit via Video Note   I, Jarold Motto, connected with  Aslynn Zoltowski  ([id number], 1965-10-20) on 11/23/22 at 10:20 AM EDT by a video-enabled telemedicine application and verified that I am speaking with the correct person using two identifiers.  Location: Patient: Home Provider: [facility] office   I discussed the limitations of evaluation and management by telemedicine and the availability of in person appointments. The patient expressed understanding and agreed to proceed.    History of Present Illness: Tara Preston is a 58 y.o. who identifies as a female who was assigned female at birth, and is being seen today for medication refills.  Reactive Airway Disease She reports that she has had some issues with needing her inhaler more frequently, especially a few weeks ago. 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   Vitiligo 07/25/2020    Allergies:  Allergies  Allergen Reactions   Oxycodone Itching   Medications:  Current Outpatient Medications:    albuterol (VENTOLIN HFA) 108 (90 Base) MCG/ACT inhaler, Inhale 2 puffs into the lungs every 6 (six) hours as needed for wheezing or shortness of breath., Disp: 8 g, Rfl: 0   budesonide-formoterol (SYMBICORT) 80-4.5 MCG/ACT inhaler, Inhale 2 puffs into the lungs 2 (two) times daily., Disp: 1 each, Rfl: 3   cetirizine (ZYRTEC) 10 MG tablet, Take 10 mg by mouth daily., Disp: , Rfl:    Cholecalciferol (VITAMIN  D3) 25 MCG (1000 UT) CHEW, Chew 1 each by mouth daily., Disp: , Rfl:    dicyclomine (BENTYL) 20 MG tablet, Take 1 tablet (20 mg total) by mouth 2 (two) times daily., Disp: 20 tablet, Rfl: 0   estradiol (VIVELLE-DOT) 0.05 MG/24HR patch, Place 1 patch onto the skin 2 (two) times a week., Disp: , Rfl:    famotidine (PEPCID) 20 MG tablet, Take 1 tablet (20 mg total) by mouth 2 (two) times daily., Disp: 30 tablet, Rfl: 0   levothyroxine (SYNTHROID) 100 MCG tablet, TAKE 1 TABLET BY MOUTH EVERY DAY, Disp: 90 tablet, Rfl: 1   montelukast (SINGULAIR) 10 MG tablet, TAKE 1 TABLET BY MOUTH EVERYDAY AT BEDTIME, Disp: 90 tablet, Rfl: 1   RESTASIS 0.05 % ophthalmic emulsion, , Disp: , Rfl:    fluticasone (FLONASE) 50 MCG/ACT nasal spray, SPRAY 2 SPRAYS INTO EACH NOSTRIL EVERY DAY, Disp: 48 mL, Rfl: 2   LORazepam (ATIVAN) 1 MG tablet, Take 1 tablet (1 mg total) by mouth 2 (two) times daily as needed for anxiety. 30 min prior to flying, Disp: 10 tablet, Rfl: 0  Observations/Objective: Patient is well-developed, well-nourished in no acute distress.  Resting comfortably  at home.  Head is normocephalic, atraumatic.  No labored breathing.  Speech is clear and coherent with logical content.  Patient is alert and oriented at baseline.    Assessment and Plan: Fear of flying Refill ativan for as needed use She is aware of how to administer medication Follow-up as needed  Moderate persistent reactive airway disease  without complication Will send in new steroid inhaler for to use twice daily during her travel Will also send in albuterol as needed for her symptoms She is asking about a flu vaccine and I did discuss with her that we do not have this to administer - encouraged regular mask use on airplanes/airports and excellent hand hygiene  Follow Up Instructions: I discussed the assessment and treatment plan with the patient. The patient was provided an opportunity to ask questions and all were answered. The  patient agreed with the plan and demonstrated an understanding of the instructions.  A copy of instructions were sent to the patient via MyChart unless otherwise noted below.   The patient was advised to call back or seek an in-person evaluation if the symptoms worsen or if the condition fails to improve as anticipated.  Jarold Motto, [place]

## 2022-11-23 NOTE — Addendum Note (Signed)
Addended by: Haynes Bast on: 11/23/2022 10:39 AM   Modules accepted: Level of Service

## 2022-12-20 ENCOUNTER — Other Ambulatory Visit: Payer: Self-pay | Admitting: Physician Assistant

## 2023-01-17 ENCOUNTER — Other Ambulatory Visit: Payer: Self-pay | Admitting: Physician Assistant

## 2023-02-11 ENCOUNTER — Other Ambulatory Visit: Payer: Self-pay | Admitting: Physician Assistant

## 2023-02-11 DIAGNOSIS — Z1231 Encounter for screening mammogram for malignant neoplasm of breast: Secondary | ICD-10-CM

## 2023-02-22 ENCOUNTER — Ambulatory Visit
Admission: RE | Admit: 2023-02-22 | Discharge: 2023-02-22 | Disposition: A | Discharge status: Home / Self Care | Payer: BC Managed Care – PPO | Source: Ambulatory Visit | Attending: Physician Assistant | Admitting: Physician Assistant

## 2023-02-22 DIAGNOSIS — Z1231 Encounter for screening mammogram for malignant neoplasm of breast: Secondary | ICD-10-CM

## 2023-02-26 ENCOUNTER — Encounter: Payer: Self-pay | Admitting: Physician Assistant

## 2023-04-08 ENCOUNTER — Other Ambulatory Visit: Payer: Self-pay | Admitting: Physician Assistant

## 2023-04-11 ENCOUNTER — Telehealth: Payer: Self-pay | Admitting: Physician Assistant

## 2023-04-11 MED ORDER — LEVOTHYROXINE SODIUM 100 MCG PO TABS: 100.0000 ug | ORAL_TABLET | Freq: Every day | ORAL | 0 refills | Status: DC

## 2023-04-11 MED ORDER — ALBUTEROL SULFATE HFA 108 (90 BASE) MCG/ACT IN AERS: 2.0000 | INHALATION_SPRAY | Freq: Four times a day (QID) | RESPIRATORY_TRACT | 1 refills | Status: DC | PRN

## 2023-04-11 NOTE — Telephone Encounter (Signed)
Prescription Request  04/11/2023  LOV: 04/30/2022  What is the name of the medication or equipment?  albuterol (VENTOLIN HFA) 108 (90 Base) MCG/ACT inhaler   levothyroxine (SYNTHROID) 100 MCG tablet    Have you contacted your pharmacy to request a refill? No   Which pharmacy would you like this sent to? [place] [id number] [facility] [place], [place] - [address] [place] [address] Phone: [phone] Fax: [phone]    Patient notified that their request is being sent to the clinical staff for review and that they should receive a response within 2 business days.   Please advise at Mobile [phone] (mobile)

## 2023-04-11 NOTE — Telephone Encounter (Signed)
Left message on personal voicemail Rx's were sent to pharmacy as requested.

## 2023-04-24 ENCOUNTER — Telehealth: Payer: Self-pay | Admitting: Physician Assistant

## 2023-04-24 ENCOUNTER — Other Ambulatory Visit: Payer: Self-pay

## 2023-04-24 DIAGNOSIS — Z0000 Encounter for general adult medical examination without abnormal findings: Secondary | ICD-10-CM

## 2023-04-24 NOTE — Telephone Encounter (Signed)
Pt has CPE on 06/03/23 @ 1:40pm. Pt would like to have labs completed prior to CPE. Can these be ordered?

## 2023-04-24 NOTE — Telephone Encounter (Signed)
Ok to place orders for labs?

## 2023-04-24 NOTE — Telephone Encounter (Signed)
Called pt and scheduled lab only appt; future lab orders placed for patient

## 2023-04-29 ENCOUNTER — Other Ambulatory Visit: Payer: BC Managed Care – PPO

## 2023-05-23 ENCOUNTER — Encounter: Payer: BC Managed Care – PPO | Admitting: Physician Assistant

## 2023-05-29 ENCOUNTER — Telehealth: Payer: Self-pay | Admitting: *Deleted

## 2023-05-29 NOTE — Telephone Encounter (Signed)
 Copied from CRM [id number]. Topic: Clinical - Medication Question >> May 28, 2023  4:54 PM Sonny Dandy B wrote: Reason for CRM: pt called to request a lab test the detects cancer. Pt is requesting a call back at [phone]

## 2023-05-29 NOTE — Telephone Encounter (Signed)
 Spoke to pt asked her what are you specifically asking for regarding blood test for cancer? Pt said a general cancer test. Told her okay I will let Samantha know. Pt said her appt is tomorrow. Told pt yes your lab appt is tomorrow.

## 2023-05-29 NOTE — Telephone Encounter (Signed)
 Please see message and advise

## 2023-05-29 NOTE — Telephone Encounter (Signed)
 Spoke to pt told her discussed with [name] and she said there is no general blood test for Cancer. She said she can discuss with you at your physical next week about Genetic counseling. Pt verbalized understanding and said that is fine.

## 2023-05-30 ENCOUNTER — Other Ambulatory Visit (INDEPENDENT_AMBULATORY_CARE_PROVIDER_SITE_OTHER): Payer: 59

## 2023-05-30 DIAGNOSIS — Z0000 Encounter for general adult medical examination without abnormal findings: Secondary | ICD-10-CM

## 2023-05-30 DIAGNOSIS — Z131 Encounter for screening for diabetes mellitus: Secondary | ICD-10-CM

## 2023-05-30 LAB — COMPREHENSIVE METABOLIC PANEL
ALT: 28 U/L (ref 0–35)
AST: 23 U/L (ref 0–37)
Albumin: 4.3 g/dL (ref 3.5–5.2)
Alkaline Phosphatase: 113 U/L (ref 39–117)
BUN: 15 mg/dL (ref 6–23)
CO2: 29 meq/L (ref 19–32)
Calcium: 9.7 mg/dL (ref 8.4–10.5)
Chloride: 102 meq/L (ref 96–112)
Creatinine, Ser: 0.82 mg/dL (ref 0.40–1.20)
GFR: 79.34 mL/min (ref >60.00)
Glucose, Bld: 78 mg/dL (ref 70–99)
Potassium: 3.5 meq/L (ref 3.5–5.1)
Sodium: 139 meq/L (ref 135–145)
Total Bilirubin: 0.5 mg/dL (ref 0.2–1.2)
Total Protein: 7.4 g/dL (ref 6.0–8.3)

## 2023-05-30 LAB — CBC WITH DIFFERENTIAL/PLATELET
Basophils Absolute: 0 10*3/uL (ref 0.0–0.1)
Basophils Relative: 0.3 % (ref 0.0–3.0)
Eosinophils Absolute: 0.1 10*3/uL (ref 0.0–0.7)
Eosinophils Relative: 1.4 % (ref 0.0–5.0)
HCT: 42.6 % (ref 36.0–46.0)
Hemoglobin: 14.1 g/dL (ref 12.0–15.0)
Lymphocytes Relative: 33.1 % (ref 12.0–46.0)
Lymphs Abs: 3 10*3/uL (ref 0.7–4.0)
MCHC: 33.1 g/dL (ref 30.0–36.0)
MCV: 93.6 fL (ref 78.0–100.0)
Monocytes Absolute: 0.6 10*3/uL (ref 0.1–1.0)
Monocytes Relative: 6.2 % (ref 3.0–12.0)
Neutro Abs: 5.3 10*3/uL (ref 1.4–7.7)
Neutrophils Relative %: 59 % (ref 43.0–77.0)
Platelets: 328 10*3/uL (ref 150.0–400.0)
RBC: 4.55 Mil/uL (ref 3.87–5.11)
RDW: 14.1 % (ref 11.5–15.5)
WBC: 9 10*3/uL (ref 4.0–10.5)

## 2023-05-30 LAB — TSH: TSH: 0.74 u[IU]/mL (ref 0.35–5.50)

## 2023-05-30 LAB — LIPID PANEL
Cholesterol: 192 mg/dL (ref 0–200)
HDL: 65 mg/dL (ref >39.00)
LDL Cholesterol: 80 mg/dL (ref 0–99)
NonHDL: 127.09
Total CHOL/HDL Ratio: 3
Triglycerides: 236 mg/dL — ABNORMAL HIGH (ref 0.0–149.0)
VLDL: 47.2 mg/dL — ABNORMAL HIGH (ref 0.0–40.0)

## 2023-05-30 LAB — HEMOGLOBIN A1C: Hgb A1c MFr Bld: 6.2 % (ref 4.6–6.5)

## 2023-06-03 ENCOUNTER — Encounter: Payer: Self-pay | Admitting: Physician Assistant

## 2023-06-03 ENCOUNTER — Telehealth: Payer: Self-pay | Admitting: Physician Assistant

## 2023-06-03 NOTE — Telephone Encounter (Signed)
 Pt has requested to transfer care from [place] to [place]. Please advise

## 2023-07-05 ENCOUNTER — Ambulatory Visit: Payer: 59 | Admitting: Family

## 2023-07-05 ENCOUNTER — Encounter: Payer: Self-pay | Admitting: Family

## 2023-07-05 VITALS — BP 149/88 | HR 69 | Temp 97.5°F | Ht 65.0 in | Wt 205.6 lb

## 2023-07-05 DIAGNOSIS — J0110 Acute frontal sinusitis, unspecified: Secondary | ICD-10-CM | POA: Diagnosis not present

## 2023-07-05 DIAGNOSIS — T7840XD Allergy, unspecified, subsequent encounter: Secondary | ICD-10-CM | POA: Diagnosis not present

## 2023-07-05 DIAGNOSIS — Z0000 Encounter for general adult medical examination without abnormal findings: Secondary | ICD-10-CM | POA: Diagnosis not present

## 2023-07-05 DIAGNOSIS — R7303 Prediabetes: Secondary | ICD-10-CM | POA: Insufficient documentation

## 2023-07-05 DIAGNOSIS — E10A1 Type 1 diabetes mellitus, presymptomatic, stage 1: Secondary | ICD-10-CM | POA: Insufficient documentation

## 2023-07-05 DIAGNOSIS — E890 Postprocedural hypothyroidism: Secondary | ICD-10-CM

## 2023-07-05 DIAGNOSIS — J4540 Moderate persistent asthma, uncomplicated: Secondary | ICD-10-CM

## 2023-07-05 MED ORDER — AMOXICILLIN-POT CLAVULANATE 875-125 MG PO TABS
1.0000 | ORAL_TABLET | Freq: Two times a day (BID) | ORAL | 0 refills | Status: DC
Start: 2023-07-05 — End: 2023-09-11

## 2023-07-05 MED ORDER — LEVOTHYROXINE SODIUM 100 MCG PO TABS: 100.0000 ug | ORAL_TABLET | Freq: Every day | ORAL | 3 refills | Status: DC

## 2023-07-05 MED ORDER — FLUTICASONE PROPIONATE 50 MCG/ACT NA SUSP: NASAL | 2 refills | Status: AC

## 2023-07-05 NOTE — Assessment & Plan Note (Signed)
Chronic A1C 6.2 w/normal fasting glucose, high triglycerides Pt advised on reducing carb intake including sweets and sweetened beverages Weight loss will also help reduce A1C Increase exercise to 20-30 minutes of cardio as many days as able. F/U in 1 year or sooner if needed

## 2023-07-05 NOTE — Progress Notes (Signed)
Phone [phone]  Subjective:   Patient is a 58 y.o. female presenting for annual physical.    Chief Complaint  Patient presents with   Annual Exam    Labs have been done.    Transfer of care   Sinus Problem    Pt c/o Nasal congestion, Present for 4 days. Has tried sudafed.    HPI: Hypothyroidism: Patient presents today for followup of Hypothyroidism.  Patient reports positive compliance with daily medication, Levothyroxine qd. She takes it alone about 1.5h before eating anything. Patient denies any of the following symptoms: fatigue, cold intolerance, constipation, weight gain or inability to lose weight, muscle weakness, mental slowing, dry hair and skin. Last TSH and free T4: Lab Results  Component Value Date   FREE T4 1.02 07/26/2020   FREE T4 1.03 03/24/2019   TSH 0.74 05/30/2023   TSH 5.15 01/11/2022   TSH 10.38 (H) 01/02/2022   Sinusitis:  pt having sinus sx for a week with thick, foul tasting green mucus, nasal congestion and facial pressure, headaches. She has tried Sudafed and has used flonase in past but ran out, does not use saline spray.   Allergies/possible Asthma - pt has not been officially dx with Asthma, but notes that she has a lot of coughing and SOB in spring and fall allergy seasons. She has an Albuterol inhaler that helps sometimes. She also reports chronic sinus sx in spring & fall with outside pollen, but has not had specific allergy testing.  See problem oriented charting- ROS- full  review of systems was completed and negative except for: what is noted in HPI above.  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   Vitiligo 07/25/2020   Past Surgical History:   Procedure Laterality Date   ABDOMINAL HYSTERECTOMY  2003   BUNIONECTOMY Left 2018   BUNIONECTOMY Right 2012   EYE SURGERY  desompression and strabism   OPTIC NERVE DECOMPRESSION  2005   orbital decompression  2005   STRABISMUS SURGERY  2006    Family History  Problem Relation Age of Onset   Diabetes Father    Cancer Father    Breast cancer Maternal Aunt     Medications- reviewed and updated Current Outpatient Medications  Medication Sig Dispense Refill   albuterol (VENTOLIN HFA) 108 (90 Base) MCG/ACT inhaler Inhale 2 puffs into the lungs every 6 (six) hours as needed for wheezing or shortness of breath. 8.5 each 1   amoxicillin-clavulanate (AUGMENTIN) 875-125 MG tablet Take 1 tablet by mouth 2 (two) times daily after a meal. 14 tablet 0   budesonide-formoterol (SYMBICORT) 80-4.5 MCG/ACT inhaler Inhale 2 puffs into the lungs 2 (two) times daily. 1 each 3   cetirizine (ZYRTEC) 10 MG tablet Take 10 mg by mouth daily.     Cholecalciferol (VITAMIN D3) 25 MCG (1000 UT) CHEW Chew 1 each by mouth daily.     dicyclomine (BENTYL) 20 MG tablet Take 1 tablet (20 mg total) by mouth 2 (two) times daily. 20 tablet 0   estradiol (VIVELLE-DOT) 0.05 MG/24HR patch Place 1 patch onto the skin 2 (two) times a week.     famotidine (PEPCID) 20 MG tablet Take 1 tablet (20 mg total) by mouth 2 (  two) times daily. 30 tablet 0   LORazepam (ATIVAN) 1 MG tablet Take 1 tablet (1 mg total) by mouth 2 (two) times daily as needed for anxiety. 30 min prior to flying 10 tablet 0   montelukast (SINGULAIR) 10 MG tablet TAKE 1 TABLET BY MOUTH EVERYDAY AT BEDTIME 90 tablet 1   OVER THE COUNTER MEDICATION Take 500 mg by mouth daily. Magnesium     RESTASIS 0.05 % ophthalmic emulsion      fluticasone (FLONASE) 50 MCG/ACT nasal spray SPRAY 2 SPRAYS INTO EACH NOSTRIL EVERY DAY 48 mL 2   levothyroxine (SYNTHROID) 100 MCG tablet Take 1 tablet (100 mcg total) by mouth daily. 90 tablet 3   No current facility-administered  medications for this visit.    Allergies-reviewed and updated Allergies  Allergen Reactions   Oxycodone Itching    Social History   Social History Narrative   3rd grade [employer]    Objective:  BP (!) 149/88 (BP Location: Left Arm, Patient Position: Sitting, Cuff Size: Normal)   Pulse 69   Temp (!) 97.5 F (36.4 C) (Temporal)   Ht 5\' 5"  (1.651 m)   Wt 205 lb 9.6 oz (93.3 kg)   SpO2 97%   BMI 34.21 kg/m  Physical Exam Vitals and nursing note reviewed.  Constitutional:      Appearance: Normal appearance.  HENT:     Head: Normocephalic.     Right Ear: Tympanic membrane normal.     Left Ear: Tympanic membrane normal.     Nose: Nose normal.     Mouth/Throat:     Mouth: Mucous membranes are moist.  Eyes:     Pupils: Pupils are equal, round, and reactive to light.  Cardiovascular:     Rate and Rhythm: Normal rate and regular rhythm.  Pulmonary:     Effort: Pulmonary effort is normal.     Breath sounds: Normal breath sounds.  Musculoskeletal:        General: Normal range of motion.     Cervical back: Normal range of motion.  Lymphadenopathy:     Cervical: No cervical adenopathy.  Skin:    General: Skin is warm and dry.  Neurological:     Mental Status: She is alert.  Psychiatric:        Mood and Affect: Mood normal.        Behavior: Behavior normal.     Assessment and Plan   Health Maintenance counseling: 1. Anticipatory guidance: Patient counseled regarding regular dental exams q6 months, eye exams,  avoiding smoking and second hand smoke, limiting alcohol to 1 beverage per day, no illicit drugs.   2. Risk factor reduction:  Advised patient of need for regular exercise and diet rich with fruits and vegetables to reduce risk of heart attack and stroke. Exercise- none.  Wt Readings from Last 3 Encounters:  07/05/23 205 lb 9.6 oz (93.3 kg)  11/23/22 192 lb (87.1 kg)  04/30/22 200 lb 12.8 oz (91.1 kg)   3. Immunizations/screenings/ancillary  studies Immunization History  Administered Date(s) Administered   Hep A / Hep B 10/30/2019   Hepatitis B 11/20/2019   Influenza Split 12/20/2010, 12/20/2011   Influenza, Seasonal, Injecte, Preservative Fre 03/25/2007, 03/18/2008, 04/11/2009   Influenza,inj,Quad PF,6+ Mos 02/16/2017, 02/21/2018, 03/11/2019, 02/18/2021   Influenza,inj,quad, With Preservative 02/21/2015   Influenza-Unspecified 03/11/2022   PFIZER(Purple Top)SARS-COV-2 Vaccination 07/17/2019, 08/08/2019, 05/28/2020   Td 10/30/2019   Tdap 12/23/2008   Varicella 04/18/2009, 06/20/2009   Zoster Recombinant(Shingrix) 09/23/2021, 01/06/2022   Health  Maintenance Due  Topic Date Due   Pneumococcal Vaccine 12-81 Years old (1 of 2 - PCV) Never done   HIV Screening  Never done   Hepatitis C Screening  Never done   INFLUENZA VACCINE  12/20/2022   COVID-19 Vaccine (4 - 2024-25 season) 01/20/2023    4. Cervical cancer screening- hysterectomy 5. Breast cancer screening-  mammogram  - up to date  6. Colon cancer screening - done at age 10 7. Skin cancer screening- advised regular sunscreen use. Denies worrisome, changing, or new skin lesions.  8. Birth control/STD check- postmenopausal/STD  9. Osteoporosis screening- N/A 10. Alcohol screening: rare 11. Smoking associated screening (lung cancer screening, AAA screen 65-75, UA)-   smoker  Annual physical exam - Pt had lab work done ahead of time. Reviewed during visit today. High triglycerides and A1C 6.2 (borderline T2DM)   Stage 1, T2, prediabetes -   Chronic A1C 6.2 w/normal fasting glucose, high triglycerides Pt advised on reducing carb intake including sweets and sweetened beverages Weight loss will also help reduce A1C Increase exercise to 20-30 minutes of cardio as many days as able. F/U in 1 year or sooner if needed  Acute non-recurrent frontal sinusitis - sending Augmentin, advised on use & SE. Advised to restart Flonase nasal spray bid x 3d, then qd. Use saline  nasal spray tid.  -     Amoxicillin-Pot Clavulanate; Take 1 tablet by mouth 2 (two) times daily after a meal.  Dispense: 14 tablet; Refill: 0  Allergy, subsequent encounter - -     Ambulatory referral to Allergy -     Fluticasone Propionate; SPRAY 2 SPRAYS INTO EACH NOSTRIL EVERY DAY  Dispense: 48 mL; Refill: 2  Moderate persistent reactive airway disease without complication - -     Ambulatory referral to Allergy  Postablative hypothyroidism - Has taken Levothyroxine for years. Denies any sx or SE from med. Recent TSH wnl. Sending refill for medication. F/U in 1 yr  -     Levothyroxine Sodium; Take 1 tablet (100 mcg total) by mouth daily.  Dispense: 90 tablet; Refill: 3   Recommended follow up:  Return for any future concerns, med refills. No future appointments.   Dulce Sellar, NP

## 2023-07-05 NOTE — Patient Instructions (Addendum)
It was very nice to see you today!   Your A1C is 6.2 which is borderline diabetes or prediabetes. This is also why your triglycerides are high. You have to reduce the carbohydrates in your diet: potatoes, rice, pasta, tortillas, bread, and all sweets. Try to increase exercise! Even 15-20 minutes as many days as able, brisk walking, biking, ellipitcal machine, etc is very beneficial.  Magnesium glycinate, L-threonate, or taurate can help with anxiety, maintaining sleep (if taken at night), muscle recovery, good bowel function, hot flashes, and maintaining blood pressure.  For magnesium look for chelated form (better absorbability) and for any over the counter supplement or vitamin, look for organic or has a 3rd party seal from NSF international, UL Solutions or USP.  This authenticates the quality but not the efficacy (since not FDA approved).       PLEASE NOTE:  If you had any lab tests please let us know if you have not heard back within a few days. You may see your results on MyChart before we have a chance to review them but we will give you a call once they are reviewed by [place]. If we ordered any referrals today, please let us know if you have not heard from their office within the next week.

## 2023-07-05 NOTE — Assessment & Plan Note (Signed)
Has taken Levothyroxine for years. Denies any sx or SE from med. Recent TSH wnl. Sending refill for medication. F/U in 1 yr

## 2023-07-09 ENCOUNTER — Other Ambulatory Visit: Payer: Self-pay | Admitting: Family

## 2023-07-09 NOTE — Telephone Encounter (Signed)
 Copied from CRM [id number]. Topic: Clinical - Medication Refill >> Jul 09, 2023  2:04 PM Dimitri Ped wrote: Most Recent Primary Care Visit:  Provider: Dulce Sellar  Department: [facility]  Visit Type: TRANSFER OF CARE  Date: 07/05/2023  Medication: albuterol (VENTOLIN HFA) 108 (90 Base) MCG/ACT inhaler  Has the patient contacted their pharmacy? No no refills  (Agent: If no, request that the patient contact the pharmacy for the refill. If patient does not wish to contact the pharmacy document the reason why and proceed with request.) (Agent: If yes, when and what did the pharmacy advise?)  Is this the correct pharmacy for this prescription? Yes If no, delete pharmacy and type the correct one.  This is the patient's preferred pharmacy:  [id number] [place] [place] - [address] [place] [address] Phone: [phone] Fax: [phone]  [facility] [address] [place] [address] Phone: [phone] Fax: [phone]  [id number] [place] [place] - [address] AT [facility] [address] [place] [address] Phone: [phone] Fax: [phone]   Has the prescription been filled recently? No  Is the patient out of the medication? Yes  Has the patient been seen for an appointment in the last year OR does the patient have an upcoming appointment? Yes  Can we respond through MyChart? Yes  Agent: Please be advised that Rx refills may take up to 3 business days. We ask that you follow-up with your pharmacy.

## 2023-07-10 MED ORDER — ALBUTEROL SULFATE HFA 108 (90 BASE) MCG/ACT IN AERS: 2.0000 | INHALATION_SPRAY | Freq: Four times a day (QID) | RESPIRATORY_TRACT | 1 refills | Status: DC | PRN

## 2023-08-29 ENCOUNTER — Other Ambulatory Visit: Payer: Self-pay | Admitting: Physician Assistant

## 2023-09-11 ENCOUNTER — Other Ambulatory Visit: Payer: Self-pay | Admitting: Internal Medicine

## 2023-09-11 ENCOUNTER — Encounter: Payer: Self-pay | Admitting: Internal Medicine

## 2023-09-11 ENCOUNTER — Ambulatory Visit: Payer: Self-pay | Admitting: Internal Medicine

## 2023-09-11 ENCOUNTER — Other Ambulatory Visit: Payer: Self-pay

## 2023-09-11 VITALS — Temp 97.5°F

## 2023-09-11 DIAGNOSIS — J310 Chronic rhinitis: Secondary | ICD-10-CM | POA: Diagnosis not present

## 2023-09-11 DIAGNOSIS — K219 Gastro-esophageal reflux disease without esophagitis: Secondary | ICD-10-CM

## 2023-09-11 DIAGNOSIS — J4530 Mild persistent asthma, uncomplicated: Secondary | ICD-10-CM

## 2023-09-11 DIAGNOSIS — R053 Chronic cough: Secondary | ICD-10-CM

## 2023-09-11 HISTORY — DX: Gastro-esophageal reflux disease without esophagitis: K21.9

## 2023-09-11 MED ORDER — ALBUTEROL SULFATE HFA 108 (90 BASE) MCG/ACT IN AERS
2.0000 | INHALATION_SPRAY | Freq: Four times a day (QID) | RESPIRATORY_TRACT | 1 refills | Status: AC | PRN
Start: 2023-09-11 — End: ?

## 2023-09-11 MED ORDER — IPRATROPIUM BROMIDE 0.03 % NA SOLN: 2.0000 | Freq: Three times a day (TID) | NASAL | 12 refills | Status: AC | PRN

## 2023-09-11 MED ORDER — BUDESONIDE-FORMOTEROL FUMARATE 160-4.5 MCG/ACT IN AERO: 2.0000 | INHALATION_SPRAY | Freq: Two times a day (BID) | RESPIRATORY_TRACT | 5 refills | Status: AC

## 2023-09-11 MED ORDER — OMEPRAZOLE 40 MG PO CPDR: 40.0000 mg | DELAYED_RELEASE_CAPSULE | Freq: Every day | ORAL | 3 refills | Status: DC

## 2023-09-11 NOTE — Patient Instructions (Addendum)
 Chronic cough and wheezing: Chronic cough and wheeze exacerbated post-COVID-19. Suspect multiple etiologies including asthma, possible reflux, possible larygospasm, and some overlap from drainage. - Increase Symbicort  to 160 mcg, two puffs twice daily. Use with spacer. Rinse mouth after use.  - Provide spacer for inhaler use. - Instruct to use albuterol  2-4 puffs as needed, seek medical attention if needed more than every four hours.  Chronic rhinitis-suspected allergic Symptoms include nasal drainage and throat irritation,sometime due to irritants or seasonal changes. Allergy testing scheduled to differentiate causes. - Continue Zyrtec  10 mg daily. - Continue Flonase , one spray twice a day or two sprays once a day. - Add Atrovent  nasal spray if drainage persists, one to two sprays up to three times a day.  If becomes dry use less frequently..  Acid reflux Suspected acid reflux contributing to throat irritation and nocturnal coughing. Silent reflux considered.  Reports once weekly stomach pain after eating certain foods. - Start omeprazole  40 mg,  30 minutes before eating, for 6-8 weeks.  COVID-19 COVID-19 infection in 2020 with persistent respiratory symptoms contributing to current issues.  Follow-up Monitor response to treatment and adjust as necessary. Allergy testing scheduled. May 15th at 2:30 PM. - Ensure Zyrtec  is stopped three days before allergy testing, continue Flonase  and Atrovent . - Continue Symbicort  use before allergy testing.      It was a pleasure meeting you in clinic today! Thank you for allowing me to participate in your care.

## 2023-09-11 NOTE — Progress Notes (Signed)
 NEW PATIENT Date of Service/Encounter:  09/11/23 Referring provider: Versa Gore, NP Primary care provider: Versa Gore, NP  Subjective:  Tara Preston is a 58 y.o. female presenting today for evaluation of chronic rhinitis, seasonal sinusitis and wheezing with cough.  History obtained from: chart review and patient.   Discussed the use of AI scribe software for clinical note transcription with the patient, who gave verbal consent to proceed.  History of Present Illness   Tara Preston is a 58 year old female with allergies and asthma who presents with persistent coughing and wheezing.  She experiences persistent coughing and wheezing, which became more regular after contracting COVID-19 in 2020. Symptoms occur daily, particularly in the mornings and sometimes in the afternoons. Her voice changes, and she feels short of breath when coughing, especially if she hasn't used her inhaler.  She uses Symbicort  as needed, often more than twice a day, which has increased over the past six months. She also takes Flonase  and Zyrtec  daily, having reduced her Zyrtec  intake from twice to once daily due to dry eyes. She experiences coughing and sinus infections, particularly with seasonal changes, and has been prone to sinus infections at least twice a year, often requiring antibiotics.  She experiences drainage down her throat, particularly during allergy symptoms, but does not have watery or itchy eyes. She has a history of dry eyes. She occasionally wakes up at night due to coughing, which is relieved by using her inhaler. She has been using the 80 mcg Symbicort  inhaler and has stopped taking Singulair  as it did not seem to help.  She experiences stomach upset about once a week, which she associates with eating certain foods, but does not report regular acid reflux symptoms. No history of eczema, food allergies, or significant medication allergies, except for a reaction to a pain medication  causing facial itching (oxycodone).  No tightness in her throat currently, but sometimes experiences it. No drainage today but had some yesterday. No watery or itchy eyes. No acid reflux but reports occasional stomach upset. No history of eczema or food allergies.      Chart Review:  Reviewed PCP notes from referral 07/05/23: "Allergies/possible Asthma - pt has not been officially dx with Asthma, but notes that she has a lot of coughing and SOB in spring and fall allergy seasons. She has an Albuterol  inhaler that helps sometimes. She also reports chronic sinus sx in spring & fall with outside pollen, but has not had specific allergy testing. " Plan: referral to A/I, start flonase .    Past Medical History: Past Medical History:  Diagnosis Date   Thyroid  disease    Medication List:  Current Outpatient Medications  Medication Sig Dispense Refill   cetirizine  (ZYRTEC ) 10 MG tablet Take 10 mg by mouth daily.     estradiol (VIVELLE-DOT) 0.05 MG/24HR patch Place 1 patch onto the skin 2 (two) times a week.     fluticasone  (FLONASE ) 50 MCG/ACT nasal spray SPRAY 2 SPRAYS INTO EACH NOSTRIL EVERY DAY 48 mL 2   levothyroxine  (SYNTHROID ) 100 MCG tablet Take 1 tablet (100 mcg total) by mouth daily. 90 tablet 3   RESTASIS 0.05 % ophthalmic emulsion      albuterol  (VENTOLIN  HFA) 108 (90 Base) MCG/ACT inhaler Inhale 2 puffs into the lungs every 6 (six) hours as needed for wheezing or shortness of breath. (Patient not taking: Reported on 09/11/2023) 8.5 each 1   Cholecalciferol (VITAMIN D3) 25 MCG (1000 UT) CHEW Chew 1 each by mouth daily. (Patient  not taking: Reported on 09/11/2023)     dicyclomine  (BENTYL ) 20 MG tablet Take 1 tablet (20 mg total) by mouth 2 (two) times daily. (Patient not taking: Reported on 09/11/2023) 20 tablet 0   famotidine  (PEPCID ) 20 MG tablet Take 1 tablet (20 mg total) by mouth 2 (two) times daily. (Patient not taking: Reported on 09/11/2023) 30 tablet 0   LORazepam  (ATIVAN ) 1 MG  tablet Take 1 tablet (1 mg total) by mouth 2 (two) times daily as needed for anxiety. 30 min prior to flying (Patient not taking: Reported on 09/11/2023) 10 tablet 0   montelukast  (SINGULAIR ) 10 MG tablet TAKE 1 TABLET BY MOUTH EVERYDAY AT BEDTIME (Patient not taking: Reported on 09/11/2023) 90 tablet 1   OVER THE COUNTER MEDICATION Take 500 mg by mouth daily. Magnesium (Patient not taking: Reported on 09/11/2023)     No current facility-administered medications for this visit.   Known Allergies:  Allergies  Allergen Reactions   Oxycodone Itching   Past Surgical History: Past Surgical History:  Procedure Laterality Date   ABDOMINAL HYSTERECTOMY  2003   BUNIONECTOMY Left 2018   BUNIONECTOMY Right 2012   EYE SURGERY  desompression and strabism   OPTIC NERVE DECOMPRESSION  2005   orbital decompression  2005   STRABISMUS SURGERY  2006   Family History: Family History  Problem Relation Age of Onset   Diabetes Father    Cancer Father    Asthma Sister    Breast cancer Maternal Aunt    Social History: Tara Preston lives okay also in a house built in 1973, no water damage, hardwood floors, gas heating, central AC, 1 indoor cat, no roaches, not using dust mite covers on the bed of pillows, no smoke exposure.  Works as a Runner, broadcasting/film/video.  No HEPA filter in the home.  [place]  Never smoker.   ROS:  All other systems negative except as noted per HPI.  Objective:  Temperature (!) 97.5 F (36.4 C), temperature source Temporal. There is no height or weight on file to calculate BMI. Physical Exam:  General Appearance:  Alert, cooperative, no distress, appears stated age  Head:  Normocephalic, without obvious abnormality, atraumatic  Eyes:  Conjunctiva clear, EOM's intact  Ears EACs normal bilaterally and normal TMs bilaterally  Nose: Nares normal, hypertrophic turbinates, normal mucosa, and no visible anterior polyps  Throat: Lips, tongue normal; teeth and gums normal,  normal posterior oropharynx  Neck: Supple, symmetrical  Lungs:   clear to auscultation bilaterally, Respirations unlabored, no coughing  Heart:  regular rate and rhythm and no murmur, Appears well perfused  Extremities: No edema  Skin: Skin color, texture, turgor normal and no rashes or lesions on visualized portions of skin  Neurologic: No gross deficits   Diagnostics: Spirometry:  Tracings reviewed. Her effort: It was hard to get consistent efforts and there is a question as to whether this reflects a maximal maneuver. FVC: 2.37L FEV1: 2.02L, 82% predicted  FEV1/FVC ratio: 0.85  Interpretation: No overt abnormalities noted given today's efforts.  Please see scanned spirometry results for details.  Labs:  Lab Orders  No laboratory test(s) ordered today     Assessment and Plan  Assessment and Plan    Chronic cough and wheezing: suspected mild persistent asthma with VCD overlap with exacerbatiions from PND and GERD.  At this point in time it is unclear how much of her symptoms are due to asthma, but will step up inhalers while working her up. Chronic cough  and wheeze exacerbated post-COVID-19. Suspect multiple etiologies including asthma, possible reflux, possible larygospasm, and some overlap from drainage. - Increase Symbicort  to 160 mcg, two puffs twice daily. Use with spacer. Rinse mouth after use.  - Provide spacer for inhaler use. - Instruct to use albuterol  2-4 puffs as needed, seek medical attention if needed more than every four hours.  Chronic rhinitis-suspected allergic Symptoms include nasal drainage and throat irritation,sometime due to irritants or seasonal changes. Allergy testing scheduled to differentiate causes. - Continue Zyrtec  10 mg daily. - Continue Flonase , one spray twice a day or two sprays once a day. - Add Atrovent  nasal spray if drainage persists, one to two sprays up to three times a day.  If becomes dry use less frequently..  Acid reflux Suspected  acid reflux contributing to throat irritation and nocturnal coughing. Silent reflux considered.  Reports once weekly stomach pain after eating certain foods. - Start omeprazole  40 mg,  30 minutes before eating, for 6-8 weeks.  COVID-19 COVID-19 infection in 2020 with persistent respiratory symptoms contributing to current issues.  Follow-up Monitor response to treatment and adjust as necessary. Allergy testing scheduled. May 15th at 2:30 PM. - Ensure Zyrtec  is stopped three days before allergy testing, continue Flonase  and Atrovent . - Continue Symbicort  use before allergy testing.      It was a pleasure meeting you in clinic today! Thank you for allowing me to participate in your care.  Jonathon Neighbors, MD [facility]    This note in its entirety was forwarded to the Provider who requested this consultation.  Other: spacer provided in clinic today.  Thank you for your kind referral. I appreciate the opportunity to take part in Reginae's care. Please do not hesitate to contact me with questions.  Sincerely,  Jonathon Neighbors, MD [facility] 

## 2023-09-30 ENCOUNTER — Ambulatory Visit: Admitting: Internal Medicine

## 2023-09-30 DIAGNOSIS — J309 Allergic rhinitis, unspecified: Secondary | ICD-10-CM

## 2023-09-30 NOTE — Progress Notes (Unsigned)
  Date of Service/Encounter:  09/30/23  Allergy testing appointment   Initial visit on 09/11/23, seen for chronic cough and rhinitis, acid reflux.  Please see that note for additional details.  Today reports for allergy diagnostic testing:    DIAGNOSTICS:  Skin Testing: {Blank single:19197::"Select foods","Environmental allergy panel","Environmental allergy panel and select foods","Food allergy panel","None","Deferred due to recent antihistamines use","deferred due to recent reaction","Pediatric Environmental Allergy Panel","Pediatric Food Panel","Select foods and environmental allergies"}. {Blank single:19197::"Adequate positive and negative controls","Inadequate positive control-testing invalid","Adequate positive and negative controls, dermatographism present, testing difficult to interpret"}. Results discussed with patient/family.   Allergy testing results were read and interpreted by myself, documented by clinical staff.  Patient provided with copy of allergy testing along with avoidance measures when indicated. ***  Jonathon Neighbors, MD  [facility]

## 2023-10-03 ENCOUNTER — Ambulatory Visit: Admitting: Internal Medicine

## 2023-10-07 ENCOUNTER — Ambulatory Visit (INDEPENDENT_AMBULATORY_CARE_PROVIDER_SITE_OTHER): Admitting: Internal Medicine

## 2023-10-07 ENCOUNTER — Encounter: Payer: Self-pay | Admitting: Internal Medicine

## 2023-10-07 DIAGNOSIS — J3089 Other allergic rhinitis: Secondary | ICD-10-CM | POA: Diagnosis not present

## 2023-10-07 DIAGNOSIS — J302 Other seasonal allergic rhinitis: Secondary | ICD-10-CM | POA: Insufficient documentation

## 2023-10-07 NOTE — Progress Notes (Signed)
 Date of Service/Encounter:  10/07/23  Allergy testing appointment   Initial visit on 09/11/23, seen for chronic cough, chronic rhinitis, GERD.  Please see that note for additional details.  Today reports for allergy diagnostic testing:    DIAGNOSTICS:  Skin Testing: Environmental allergy panel. Adequate positive and negative controls. Results discussed with patient/family.  Airborne Adult Perc - 10/07/23 1338     Time Antigen Placed 1338    Allergen Manufacturer Floyd Hutchinson    Location Back    Number of Test 55    Panel 1 Select    2. Control-Histamine 3+    3. Bahia Negative    4. [place] Negative    5. Johnson 3+    6. [place]  Blue 4+    7. Meadow Fescue 3+    8. Perennial Rye 4+    9. Timothy 4+    10. Ragweed Mix Negative    11. Cocklebur 3+    12. Plantain,  English 3+    13. Baccharis 3+    14. Dog Fennel 3+    15. Guern[place] Thistle 2+    16. Lamb's Quarters 3+    17. Sheep Sorrell 3+    18. Rough Pigweed 3+    19. Marsh Elder, Rough 2+    20. Mugwort, Common 2+    21. Box, Elder 3+    22. Cedar, red Negative    23. Sweet Gum 4+    24. Pecan Pollen 4+    25. Pine Mix 2+    26. Walnut, Black Pollen 3+    27. Red Mulberry 3+    28. Ash Mix Negative    29. Birch Mix 4+    30. Beech American 4+    31. Cottonwood, [place] 2+    32. Hickory, White 4+    33. Maple Mix 3+    34. Oak, [place] Mix 4+    35. Sycamore Eastern 4+    36. Alternaria Alternata Negative    37. Cladosporium Herbarum Negative    38. Aspergillus Mix Negative    39. Penicillium Mix Negative    40. Bipolaris Sorokiniana (Helminthosporium) Negative    41. Drechslera Spicifera (Curvularia) Negative    42. Mucor Plumbeus Negative    43. Fusarium Moniliforme Negative    44. Aureobasidium Pullulans (pullulara) Negative    45. Rhizopus Oryzae Negative    46. Botrytis Cinera Negative    47. Epicoccum Nigrum Negative    48. Phoma Betae Negative    49. Dust Mite Mix 3+    50. Cat Hair 10,000  BAU/ml 2+    51.  Dog Epithelia Negative    52. Mixed Feathers Negative    53. Horse Epithelia Negative    54. Cockroach, German Negative    55. Tobacco Leaf Negative             Intradermal - 10/07/23 1413     Time Antigen Placed 1413    Allergen Manufacturer Floyd Hutchinson    Location Arm    Number of Test 9    Control Negative    Bahia 3+    [place] 3+    Mold 1 Negative    Mold 2 Negative    Mold 3 Negative    Mold 4 Negative    Dog 2+    Cockroach Negative             Allergy testing results were read and interpreted by myself, documented by clinical staff.  Patient provided with copy of  allergy testing along with avoidance measures when indicated.   Jonathon Neighbors, MD  [facility] 

## 2023-10-07 NOTE — Patient Instructions (Signed)
 Chronic cough and wheezing: Chronic cough and wheeze exacerbated post-COVID-19. Suspect multiple etiologies including asthma, possible reflux, possible larygospasm, and some overlap from drainage. - Increase Symbicort  to 160 mcg, two puffs twice daily. Use with spacer. Rinse mouth after use.  - Provide spacer for inhaler use. - Instruct to use albuterol  2-4 puffs as needed, seek medical attention if needed more than every four hours.  Seasonal and perennial allergic rhinitis. Symptoms include nasal drainage and throat irritation,sometime due to irritants or seasonal changes. Allergy testing scheduled to differentiate causes. - Continue Zyrtec  10 mg daily. - Continue Flonase , one spray twice a day or two sprays once a day. - Add Atrovent  nasal spray if drainage persists, one to two sprays up to three times a day.  If becomes dry use less frequently.. Allergy testing today positive to grass, weed, tree pollen, dust mites, cat, dog. Allergen avoidance. -Consider allergy injections to reduce lifetime symptoms and need for medications by teaching your immune system to become tolerant of the environmental allergens you are allergic to  Acid reflux Suspected acid reflux contributing to throat irritation and nocturnal coughing. Silent reflux considered.  Reports once weekly stomach pain after eating certain foods. - Start omeprazole  40 mg,  30 minutes before eating, for 6-8 weeks.  COVID-19 COVID-19 infection in 2020 with persistent respiratory symptoms contributing to current issues.  Follow-up 3 months, sooner if starting allergy injections or issues. It was a pleasure seeing you again in clinic today! Thank you for allowing me to participate in your care.

## 2023-10-09 ENCOUNTER — Telehealth: Payer: Self-pay

## 2023-10-09 NOTE — Telephone Encounter (Signed)
 Patient called to inform me that she called her insurance and was approved to start allergy injections. Patient is going to come in tomorrow to sign consent and make appointment to start allergy injections.

## 2023-10-21 ENCOUNTER — Other Ambulatory Visit: Payer: Self-pay | Admitting: Internal Medicine

## 2023-10-21 DIAGNOSIS — J302 Other seasonal allergic rhinitis: Secondary | ICD-10-CM

## 2023-10-21 MED ORDER — EPINEPHRINE 0.3 MG/0.3ML IJ SOAJ: 0.3000 mg | INTRAMUSCULAR | 2 refills | Status: AC | PRN

## 2023-10-21 NOTE — Progress Notes (Signed)
 AIT Rx written.

## 2023-10-22 DIAGNOSIS — J301 Allergic rhinitis due to pollen: Secondary | ICD-10-CM | POA: Diagnosis not present

## 2023-10-22 NOTE — Progress Notes (Signed)
 VIALS MADE 10-22-23

## 2023-10-22 NOTE — Progress Notes (Signed)
 Aeroallergen Immunotherapy  Ordering Provider: Dr. Jonathon Neighbors  Patient Details Name: Tara Preston MRN: [id number] Date of Birth: 18-Mar-1966  Order 1 of 2  Vial Label: G/W/T  0.3 ml (Volume)  BAU Concentration -- 7 Grass Mix* 100,000 ([place]  Blue, [place], [place], Perennial Rye, RedTop, Sweet Vernal, Timothy) 0.2 ml (Volume)  1:20 Concentration -- Bahia 0.3 ml (Volume)  BAU Concentration -- [place] 10,000 0.2 ml (Volume)  1:20 Concentration -- Johnson 0.3 ml (Volume)  1:20 Concentration -- Ragweed Mix 0.2 ml (Volume)  1:20 Concentration -- Cocklebur 0.2 ml (Volume)  1:10 Concentration -- Plantain English 0.2 ml (Volume)  1:20 Concentration -- Guernsey Thistle 0.2 ml (Volume)  1:40 Concentration -- Baccharis 0.2 ml (Volume)  1:80 Concentration -- Dogfennel 0.5 ml (Volume)  1:20 Concentration -- Weed Mix* 0.5 ml (Volume)  1:20 Concentration -- Eastern 10 Tree Mix (also Sweet Gum) 0.2 ml (Volume)  1:20 Concentration -- Box Elder 0.2 ml (Volume)  1:10 Concentration -- Pecan Pollen 0.2 ml (Volume)  1:10 Concentration -- Pine Mix 0.2 ml (Volume)  1:20 Concentration -- Red Mulberry 0.2 ml (Volume)  1:20 Concentration -- Walnut, Black Pollen   4.3  ml Extract Subtotal 0.7  ml Diluent  5.0  ml Maintenance Total  Schedule:  B  Blue Vial (1:100,000): Schedule B (6 doses) Yellow Vial (1:10,000): Schedule B (6 doses) Green Vial (1:1,000): Schedule B (6 doses) Red Vial (1:100): Schedule A (14 doses)  Special Instructions: After completion of the first Red Vial, please space to every two weeks. After completion of the second Red Vial, please space to every 4 weeks. Ok to up dose new vials at 0.8mL --> 0.3 mL --> 0.5 mL. Ok to come twice weekly (except in red), if desired, as long as there is 48 hours between injections.

## 2023-10-22 NOTE — Progress Notes (Signed)
 Aeroallergen Immunotherapy   Ordering Provider: Dr. Jonathon Neighbors   Patient Details  Name: Tara Preston  MRN: [id number]  Date of Birth: Oct 25, 1965   Order 2 of 2   Vial Label: DM/C/D/CR   0.5 ml (Volume)  1:10 Concentration -- Cat Hair  0.3 ml (Volume)  1:20 Concentration -- Cockroach, German  0.5 ml (Volume)  1:10 Concentration -- Dog Epithelia  0.5 ml (Volume)   AU Concentration -- Mite Mix (DF 5,000 & DP 5,000)    1.8  ml Extract Subtotal  3.2  ml Diluent   5.0  ml Maintenance Total   Schedule:  B   Blue Vial (1:100,000): Schedule B (6 doses)  Yellow Vial (1:10,000): Schedule B (6 doses)  Green Vial (1:1,000): Schedule B (6 doses)  Red Vial (1:100): Schedule A (14 doses)   Special Instructions: After completion of the first Red Vial, please space to every two weeks. After completion of the second Red Vial, please space to every 4 weeks. Ok to up dose new vials at 0.75mL --> 0.3 mL --> 0.5 mL. Ok to come twice weekly (except in red), if desired, as long as there is 48 hours between injections.

## 2023-10-23 DIAGNOSIS — J3089 Other allergic rhinitis: Secondary | ICD-10-CM | POA: Diagnosis not present

## 2023-10-31 ENCOUNTER — Ambulatory Visit

## 2023-10-31 DIAGNOSIS — J309 Allergic rhinitis, unspecified: Secondary | ICD-10-CM | POA: Diagnosis not present

## 2023-10-31 NOTE — Progress Notes (Signed)
 Immunotherapy   Patient Details  Name: Tara Preston MRN: [id number] Date of Birth: 10-03-1965  10/31/2023  Tara Preston started injections for  Dustmite-Cat-Dog-Cockroach & Tara Preston Following schedule: B  Frequency: 1-2x weekly Epi-Pen: Prescription for Epi-Pen given Consent signed and patient instructions given. Patient waited in office 30 minutes with no issues.    Merwyn Achilles 10/31/2023, 4:14 PM

## 2023-11-01 MED ORDER — EPINEPHRINE 0.3 MG/0.3ML IJ SOAJ: 0.3000 mg | INTRAMUSCULAR | 0 refills | Status: AC | PRN

## 2023-11-04 ENCOUNTER — Ambulatory Visit (INDEPENDENT_AMBULATORY_CARE_PROVIDER_SITE_OTHER)

## 2023-11-04 DIAGNOSIS — J309 Allergic rhinitis, unspecified: Secondary | ICD-10-CM | POA: Diagnosis not present

## 2023-11-08 ENCOUNTER — Ambulatory Visit (INDEPENDENT_AMBULATORY_CARE_PROVIDER_SITE_OTHER): Payer: Self-pay

## 2023-11-08 DIAGNOSIS — J309 Allergic rhinitis, unspecified: Secondary | ICD-10-CM | POA: Diagnosis not present

## 2023-11-11 ENCOUNTER — Ambulatory Visit (INDEPENDENT_AMBULATORY_CARE_PROVIDER_SITE_OTHER): Payer: Self-pay

## 2023-11-11 DIAGNOSIS — J309 Allergic rhinitis, unspecified: Secondary | ICD-10-CM | POA: Diagnosis not present

## 2023-11-14 ENCOUNTER — Ambulatory Visit

## 2023-11-14 DIAGNOSIS — J309 Allergic rhinitis, unspecified: Secondary | ICD-10-CM

## 2023-11-18 ENCOUNTER — Ambulatory Visit (INDEPENDENT_AMBULATORY_CARE_PROVIDER_SITE_OTHER)

## 2023-11-18 DIAGNOSIS — J309 Allergic rhinitis, unspecified: Secondary | ICD-10-CM

## 2023-11-20 ENCOUNTER — Telehealth: Payer: Self-pay

## 2023-11-20 ENCOUNTER — Other Ambulatory Visit: Payer: Self-pay | Admitting: Internal Medicine

## 2023-11-20 DIAGNOSIS — F40243 Fear of flying: Secondary | ICD-10-CM

## 2023-11-20 NOTE — Telephone Encounter (Signed)
 Copied from CRM [id number]. Topic: Clinical - Medication Question >> Nov 20, 2023  2:01 PM Aisha D wrote: Reason for CRM: Pt stated that she will be flying on next Wednesday and would need to have the LORazepam  (ATIVAN ) 1 MG tablet refilled. Pt stated that she only gets the medication refilled when she getting on a plane. Pt would like to have the medication sent to the pharmacy today so that she can get it over the weekend and would like a callback with an update on the request.  Please see pt msg via call to [facility] and advise

## 2023-11-21 ENCOUNTER — Telehealth: Payer: Self-pay | Admitting: Allergy & Immunology

## 2023-11-21 ENCOUNTER — Ambulatory Visit (INDEPENDENT_AMBULATORY_CARE_PROVIDER_SITE_OTHER)

## 2023-11-21 DIAGNOSIS — J309 Allergic rhinitis, unspecified: Secondary | ICD-10-CM

## 2023-11-21 MED ORDER — LORAZEPAM 1 MG PO TABS
1.0000 mg | ORAL_TABLET | Freq: Two times a day (BID) | ORAL | 0 refills | Status: AC | PRN
Start: 2023-11-21 — End: ?

## 2023-11-21 NOTE — Telephone Encounter (Signed)
 [facility].

## 2023-11-21 NOTE — Telephone Encounter (Signed)
 Can someone call and see what exactly she needed? I am not sure when Dr. Marinda is returning, but this is possibly something that she can handle next week.   Marty Shaggy, MD [facility] 

## 2023-11-21 NOTE — Telephone Encounter (Signed)
 PT requested f/u to discuss possible doctors note for upcoming traveling

## 2023-11-25 ENCOUNTER — Ambulatory Visit (INDEPENDENT_AMBULATORY_CARE_PROVIDER_SITE_OTHER)

## 2023-11-25 DIAGNOSIS — J309 Allergic rhinitis, unspecified: Secondary | ICD-10-CM | POA: Diagnosis not present

## 2023-11-25 NOTE — Telephone Encounter (Signed)
 I called the patient. I spoke to the patient and informed of message. The patient is very upset as she said there is no water at the airport. I informed after TSA there is water for purchase at the stores at the airport. She expressed that her sisters allergist gave her a letter saying that she needs to travel with water and she is wanting the same. I did inform her that our provider can not give that letter as she can get the water after passing through TSA. Patient expressed that in [place] she needs the letter. I did tell her that upon research the [place] airport expressed the same rules as [place] airports. Patient stated that she was not pleased as she wanted the note and it was not our business nor the providers business to tell her that can or can not have a full water bottle to go through TSA. Patient stated that she will find another provider as she travels often and needs this letter often.

## 2023-11-25 NOTE — Telephone Encounter (Signed)
 I completely understand her concern, and I'm truly sorry for any inconvenience this may cause. Unfortunately, based on her current symptoms, I am not able to provide medical justification for an exemption from TSA liquid restrictions. The simplest and most appropriate solution would be to purchase water after passing through security. Please don't hesitate to reach out if she has any further questions or concerns.

## 2023-11-25 NOTE — Telephone Encounter (Signed)
 Hi Maria-she will be allowed to have water on the plane. She will have to purchase it after she goes through security as they will not let her go through security with it.  She will not have any problems after that though and she can straight to a kiosk or store to purchase.  Unfortunately a letter will not allow her to go through security with the water.

## 2023-11-25 NOTE — Telephone Encounter (Signed)
 I called the patient and she informed me that she needs a letter stating that she needs to take allergy  medications and they make her thirsty and to allow her to take water on the plane. She stated she needs the letter today as she leaves tomorrow. I informed the patient I would send an urgent message to provider.  I also informed if letter not done in time before we close today we can send it through mychart.

## 2023-11-26 NOTE — Telephone Encounter (Signed)
 I called the patient to go over Dr. Marinda previous note. I left a message for the patient to call the office back.

## 2023-11-27 NOTE — Telephone Encounter (Signed)
 I called the patient back to go over both notes by the providers. Her mailbox is full so I could not leave a message.

## 2023-12-05 NOTE — Telephone Encounter (Signed)
 I called the patient back to go over both notes by the providers. Her mailbox is full so I could not leave a message.

## 2023-12-13 ENCOUNTER — Other Ambulatory Visit: Payer: Self-pay | Admitting: Internal Medicine

## 2023-12-31 ENCOUNTER — Ambulatory Visit (INDEPENDENT_AMBULATORY_CARE_PROVIDER_SITE_OTHER)

## 2023-12-31 DIAGNOSIS — J309 Allergic rhinitis, unspecified: Secondary | ICD-10-CM | POA: Diagnosis not present

## 2024-01-02 ENCOUNTER — Ambulatory Visit (INDEPENDENT_AMBULATORY_CARE_PROVIDER_SITE_OTHER)

## 2024-01-02 DIAGNOSIS — J309 Allergic rhinitis, unspecified: Secondary | ICD-10-CM | POA: Diagnosis not present

## 2024-01-10 ENCOUNTER — Ambulatory Visit

## 2024-01-10 DIAGNOSIS — J309 Allergic rhinitis, unspecified: Secondary | ICD-10-CM | POA: Diagnosis not present

## 2024-01-15 ENCOUNTER — Ambulatory Visit (INDEPENDENT_AMBULATORY_CARE_PROVIDER_SITE_OTHER)

## 2024-01-15 DIAGNOSIS — J309 Allergic rhinitis, unspecified: Secondary | ICD-10-CM

## 2024-01-21 ENCOUNTER — Ambulatory Visit (INDEPENDENT_AMBULATORY_CARE_PROVIDER_SITE_OTHER)

## 2024-01-21 DIAGNOSIS — J309 Allergic rhinitis, unspecified: Secondary | ICD-10-CM

## 2024-01-23 ENCOUNTER — Ambulatory Visit (INDEPENDENT_AMBULATORY_CARE_PROVIDER_SITE_OTHER)

## 2024-01-23 DIAGNOSIS — J309 Allergic rhinitis, unspecified: Secondary | ICD-10-CM | POA: Diagnosis not present

## 2024-01-28 ENCOUNTER — Ambulatory Visit (INDEPENDENT_AMBULATORY_CARE_PROVIDER_SITE_OTHER)

## 2024-01-28 DIAGNOSIS — J309 Allergic rhinitis, unspecified: Secondary | ICD-10-CM | POA: Diagnosis not present

## 2024-01-30 ENCOUNTER — Ambulatory Visit (INDEPENDENT_AMBULATORY_CARE_PROVIDER_SITE_OTHER)

## 2024-01-30 DIAGNOSIS — J309 Allergic rhinitis, unspecified: Secondary | ICD-10-CM | POA: Diagnosis not present

## 2024-02-04 ENCOUNTER — Ambulatory Visit (INDEPENDENT_AMBULATORY_CARE_PROVIDER_SITE_OTHER)

## 2024-02-04 DIAGNOSIS — J309 Allergic rhinitis, unspecified: Secondary | ICD-10-CM

## 2024-02-06 ENCOUNTER — Ambulatory Visit (INDEPENDENT_AMBULATORY_CARE_PROVIDER_SITE_OTHER)

## 2024-02-06 DIAGNOSIS — J309 Allergic rhinitis, unspecified: Secondary | ICD-10-CM

## 2024-02-11 ENCOUNTER — Ambulatory Visit (INDEPENDENT_AMBULATORY_CARE_PROVIDER_SITE_OTHER)

## 2024-02-11 DIAGNOSIS — J309 Allergic rhinitis, unspecified: Secondary | ICD-10-CM | POA: Diagnosis not present

## 2024-02-13 ENCOUNTER — Ambulatory Visit (INDEPENDENT_AMBULATORY_CARE_PROVIDER_SITE_OTHER)

## 2024-02-13 DIAGNOSIS — J309 Allergic rhinitis, unspecified: Secondary | ICD-10-CM

## 2024-02-18 ENCOUNTER — Ambulatory Visit (INDEPENDENT_AMBULATORY_CARE_PROVIDER_SITE_OTHER)

## 2024-02-18 DIAGNOSIS — J309 Allergic rhinitis, unspecified: Secondary | ICD-10-CM | POA: Diagnosis not present

## 2024-02-25 ENCOUNTER — Ambulatory Visit (INDEPENDENT_AMBULATORY_CARE_PROVIDER_SITE_OTHER)

## 2024-02-25 DIAGNOSIS — J309 Allergic rhinitis, unspecified: Secondary | ICD-10-CM | POA: Diagnosis not present

## 2024-03-03 ENCOUNTER — Ambulatory Visit

## 2024-03-03 DIAGNOSIS — J309 Allergic rhinitis, unspecified: Secondary | ICD-10-CM | POA: Diagnosis not present

## 2024-03-10 ENCOUNTER — Ambulatory Visit (INDEPENDENT_AMBULATORY_CARE_PROVIDER_SITE_OTHER)

## 2024-03-10 DIAGNOSIS — J309 Allergic rhinitis, unspecified: Secondary | ICD-10-CM | POA: Diagnosis not present

## 2024-03-17 ENCOUNTER — Ambulatory Visit (INDEPENDENT_AMBULATORY_CARE_PROVIDER_SITE_OTHER)

## 2024-03-17 DIAGNOSIS — J309 Allergic rhinitis, unspecified: Secondary | ICD-10-CM | POA: Diagnosis not present

## 2024-03-24 ENCOUNTER — Ambulatory Visit

## 2024-03-24 DIAGNOSIS — J309 Allergic rhinitis, unspecified: Secondary | ICD-10-CM | POA: Diagnosis not present

## 2024-03-26 ENCOUNTER — Other Ambulatory Visit: Payer: Self-pay | Admitting: Physician Assistant

## 2024-03-26 DIAGNOSIS — Z1231 Encounter for screening mammogram for malignant neoplasm of breast: Secondary | ICD-10-CM

## 2024-04-02 ENCOUNTER — Ambulatory Visit (INDEPENDENT_AMBULATORY_CARE_PROVIDER_SITE_OTHER)

## 2024-04-02 DIAGNOSIS — J309 Allergic rhinitis, unspecified: Secondary | ICD-10-CM | POA: Diagnosis not present

## 2024-04-04 ENCOUNTER — Other Ambulatory Visit: Payer: Self-pay | Admitting: Internal Medicine

## 2024-04-09 ENCOUNTER — Ambulatory Visit (INDEPENDENT_AMBULATORY_CARE_PROVIDER_SITE_OTHER): Admitting: *Deleted

## 2024-04-09 DIAGNOSIS — J309 Allergic rhinitis, unspecified: Secondary | ICD-10-CM

## 2024-04-14 ENCOUNTER — Ambulatory Visit (INDEPENDENT_AMBULATORY_CARE_PROVIDER_SITE_OTHER)

## 2024-04-14 DIAGNOSIS — J309 Allergic rhinitis, unspecified: Secondary | ICD-10-CM | POA: Diagnosis not present

## 2024-04-21 ENCOUNTER — Ambulatory Visit
Admission: RE | Admit: 2024-04-21 | Discharge: 2024-04-21 | Disposition: A | Source: Ambulatory Visit | Attending: Physician Assistant

## 2024-04-21 ENCOUNTER — Ambulatory Visit

## 2024-04-21 DIAGNOSIS — J309 Allergic rhinitis, unspecified: Secondary | ICD-10-CM | POA: Diagnosis not present

## 2024-04-21 DIAGNOSIS — Z1231 Encounter for screening mammogram for malignant neoplasm of breast: Secondary | ICD-10-CM

## 2024-04-29 DIAGNOSIS — J3081 Allergic rhinitis due to animal (cat) (dog) hair and dander: Secondary | ICD-10-CM | POA: Diagnosis not present

## 2024-04-29 DIAGNOSIS — J301 Allergic rhinitis due to pollen: Secondary | ICD-10-CM | POA: Diagnosis not present

## 2024-04-29 DIAGNOSIS — J302 Other seasonal allergic rhinitis: Secondary | ICD-10-CM | POA: Diagnosis not present

## 2024-04-29 DIAGNOSIS — J3089 Other allergic rhinitis: Secondary | ICD-10-CM | POA: Diagnosis not present

## 2024-04-29 NOTE — Progress Notes (Signed)
 VIALS MADE ON 04/29/24

## 2024-05-05 ENCOUNTER — Ambulatory Visit

## 2024-05-05 DIAGNOSIS — J309 Allergic rhinitis, unspecified: Secondary | ICD-10-CM

## 2024-05-26 ENCOUNTER — Ambulatory Visit (INDEPENDENT_AMBULATORY_CARE_PROVIDER_SITE_OTHER)

## 2024-05-26 DIAGNOSIS — J302 Other seasonal allergic rhinitis: Secondary | ICD-10-CM

## 2024-06-09 ENCOUNTER — Ambulatory Visit

## 2024-06-09 DIAGNOSIS — J302 Other seasonal allergic rhinitis: Secondary | ICD-10-CM

## 2024-06-09 DIAGNOSIS — J3089 Other allergic rhinitis: Secondary | ICD-10-CM

## 2024-06-12 ENCOUNTER — Ambulatory Visit: Admitting: Family

## 2024-06-12 VITALS — BP 116/72 | HR 62 | Temp 97.2°F | Ht 65.0 in | Wt 200.6 lb

## 2024-06-12 DIAGNOSIS — Z1159 Encounter for screening for other viral diseases: Secondary | ICD-10-CM

## 2024-06-12 DIAGNOSIS — Z1211 Encounter for screening for malignant neoplasm of colon: Secondary | ICD-10-CM

## 2024-06-12 DIAGNOSIS — Z114 Encounter for screening for human immunodeficiency virus [HIV]: Secondary | ICD-10-CM

## 2024-06-12 DIAGNOSIS — Z0000 Encounter for general adult medical examination without abnormal findings: Secondary | ICD-10-CM | POA: Diagnosis not present

## 2024-06-12 DIAGNOSIS — Z6833 Body mass index (BMI) 33.0-33.9, adult: Secondary | ICD-10-CM | POA: Diagnosis not present

## 2024-06-12 DIAGNOSIS — E6609 Other obesity due to excess calories: Secondary | ICD-10-CM | POA: Insufficient documentation

## 2024-06-12 DIAGNOSIS — E669 Obesity, unspecified: Secondary | ICD-10-CM

## 2024-06-12 DIAGNOSIS — E890 Postprocedural hypothyroidism: Secondary | ICD-10-CM

## 2024-06-12 DIAGNOSIS — E10A1 Type 1 diabetes mellitus, presymptomatic, stage 1: Secondary | ICD-10-CM | POA: Diagnosis not present

## 2024-06-12 DIAGNOSIS — F419 Anxiety disorder, unspecified: Secondary | ICD-10-CM | POA: Insufficient documentation

## 2024-06-12 DIAGNOSIS — Z23 Encounter for immunization: Secondary | ICD-10-CM | POA: Diagnosis not present

## 2024-06-12 DIAGNOSIS — J301 Allergic rhinitis due to pollen: Secondary | ICD-10-CM | POA: Insufficient documentation

## 2024-06-12 NOTE — Progress Notes (Deleted)
 "  Patient ID: Tara Preston, female    DOB: [birth year], 59 y.o.   MRN: [id number]  No chief complaint on file.           Assessment & Plan:   Subjective:    Outpatient Medications Prior to Visit  Medication Sig Dispense Refill   albuterol  (VENTOLIN  HFA) 108 (90 Base) MCG/ACT inhaler Inhale 2 puffs into the lungs every 6 (six) hours as needed for wheezing or shortness of breath. 8.5 each 1   budesonide -formoterol  (SYMBICORT ) 160-4.5 MCG/ACT inhaler Inhale 2 puffs into the lungs 2 (two) times daily. 1 each 5   cetirizine  (ZYRTEC ) 10 MG tablet Take 10 mg by mouth daily.     omeprazole  (PRILOSEC) 40 MG capsule TAKE 30 MINUTES PRIOR TO MORNING MEAL ON EMPTY STOMACH. 90 capsule 1   RESTASIS 0.05 % ophthalmic emulsion      Cholecalciferol (VITAMIN D3) 25 MCG (1000 UT) CHEW Chew 1 each by mouth daily. (Patient not taking: Reported on 09/11/2023)     dicyclomine  (BENTYL ) 20 MG tablet Take 1 tablet (20 mg total) by mouth 2 (two) times daily. (Patient not taking: Reported on 09/11/2023) 20 tablet 0   EPINEPHrine  (EPIPEN  2-PAK) 0.3 mg/0.3 mL IJ SOAJ injection Inject 0.3 mg into the muscle as needed for anaphylaxis. Bring to your allergy  injection appointments. 2 each 2   EPINEPHrine  0.3 mg/0.3 mL IJ SOAJ injection Inject 0.3 mg into the muscle as needed for anaphylaxis. 0.3 mL 0   estradiol (VIVELLE-DOT) 0.05 MG/24HR patch Place 1 patch onto the skin 2 (two) times a week.     famotidine  (PEPCID ) 20 MG tablet Take 1 tablet (20 mg total) by mouth 2 (two) times daily. (Patient not taking: Reported on 09/11/2023) 30 tablet 0   fluticasone  (FLONASE ) 50 MCG/ACT nasal spray SPRAY 2 SPRAYS INTO EACH NOSTRIL EVERY DAY 48 mL 2   ipratropium (ATROVENT ) 0.03 % nasal spray Place 2 sprays into both nostrils 3 (three) times daily as needed. 30 mL 12   levothyroxine  (SYNTHROID ) 100 MCG tablet Take 1 tablet (100 mcg total) by mouth daily. 90 tablet 3   LORazepam  (ATIVAN ) 1 MG tablet Take 1 tablet (1 mg total) by mouth 2  (two) times daily as needed for anxiety. 30 min prior to flying 10 tablet 0   montelukast  (SINGULAIR ) 10 MG tablet TAKE 1 TABLET BY MOUTH EVERYDAY AT BEDTIME (Patient not taking: Reported on 09/11/2023) 90 tablet 1   OVER THE COUNTER MEDICATION Take 500 mg by mouth daily. Magnesium (Patient not taking: Reported on 09/11/2023)     No facility-administered medications prior to visit.   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  Constitutional:      Appearance: Normal appearance.  Cardiovascular:     Rate and Rhythm: Normal rate and regular rhythm.  Pulmonary:     Effort: Pulmonary effort is  normal.     Breath sounds: Normal breath sounds.  Musculoskeletal:        General: Normal range of motion.  Skin:    General: Skin is warm and dry.  Neurological:     Mental Status: She is alert.  Psychiatric:        Mood and Affect: Mood normal.        Behavior: Behavior normal.    BP 116/72 (BP Location: Left Arm, Patient Position: Sitting)   Pulse 62   Temp (!) 97.2 F (36.2 C) (Temporal)   Ht 5' 5 (1.651 m)   Wt 200 lb 9.6 oz (91 kg)   SpO2 98%   BMI 33.38 kg/m  Wt Readings from Last 3 Encounters:  06/12/24 200 lb 9.6 oz (91 kg)  07/05/23 205 lb 9.6 oz (93.3 kg)  11/23/22 192 lb (87.1 kg)       Randal Goens, NP     [1]  Allergies Allergen Reactions   Oxycodone Itching   "

## 2024-06-12 NOTE — Patient Instructions (Addendum)
 It was very nice to see you today!   I will review your lab results via MyChart in a few days.  You look great! Stay well! Remember to try and exercise up to 30 minutes, moderate cardio, as many days as possible!  I hope your mother recovers quickly from her surgery :-)      PLEASE NOTE:  If you had any lab tests please let us  know if you have not heard back within a few days. You may see your results on MyChart before we have a chance to review them but we will give you a call once they are reviewed by us . If we ordered any referrals today, please let us  know if you have not heard from their office within the next week.

## 2024-06-12 NOTE — Progress Notes (Signed)
 " Phone [phone]  Subjective:   Patient is a 59 y.o. female presenting for annual physical.    Chief Complaint  Patient presents with   Annual Exam    Non fasting  Discussed the use of AI scribe software for clinical note transcription with the patient, who gave verbal consent to proceed.  History of Present Illness Tara Preston is a 59 year old female who presents for an annual physical exam.  She is overdue for a follow-up colonoscopy after prior polyps were found more than six years ago and was advised to repeat it at five years. She is planning to receive allergy  shots for her persistent cough. She has hyperthyroidism treated with levothyroxine  100 mcg daily. She asks to check her white blood cell count for cancer screening and reports a family history of cancer in her grandfather and aunt.  See problem oriented charting- ROS- full  review of systems was completed and negative except for: what is noted in HPI above.  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   Gastroesophageal reflux disease 09/11/2023   Non morbid obesity due to excess calories 06/12/2024   Seasonal allergic rhinitis due to pollen 06/12/2024   Thyroid  disease    Patient Active Problem List   Diagnosis Date Noted   Seasonal and perennial allergic rhinitis 10/07/2023   Mild persistent asthma without complication 09/11/2023   Gastroesophageal reflux disease 09/11/2023   Stage 1 presymptomatic normoglycemic type 1 diabetes mellitus 07/05/2023   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   History of thyroidectomy 07/25/2020   Body mass index (BMI) 30.0-30.9, adult 07/25/2020   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   Vitiligo 07/25/2020   Past Surgical History:  Procedure Laterality Date   ABDOMINAL HYSTERECTOMY  2003   BUNIONECTOMY Left 2018   BUNIONECTOMY Right 2012   EYE SURGERY   desompression and strabism   OPTIC NERVE DECOMPRESSION  2005   orbital decompression  2005   STRABISMUS SURGERY  2006    Family History  Problem Relation Age of Onset   Diabetes Father    Cancer Father    Asthma Sister    Breast cancer Maternal Aunt     Medications- reviewed and updated Current Outpatient Medications  Medication Sig Dispense Refill   albuterol  (VENTOLIN  HFA) 108 (90 Base) MCG/ACT inhaler Inhale 2 puffs into the lungs every 6 (six) hours as needed for wheezing or shortness of breath. 8.5 each 1   budesonide -formoterol  (SYMBICORT ) 160-4.5 MCG/ACT inhaler Inhale 2 puffs into the lungs 2 (two) times daily. 1 each 5   cetirizine  (ZYRTEC ) 10 MG tablet Take 10 mg by mouth daily.     EPINEPHrine  (EPIPEN  2-PAK) 0.3 mg/0.3 mL IJ SOAJ injection Inject 0.3 mg into the muscle as needed for anaphylaxis. Bring to your allergy  injection appointments. 2 each 2   EPINEPHrine  0.3 mg/0.3 mL IJ SOAJ injection Inject 0.3 mg into the muscle as needed for anaphylaxis. 0.3 mL 0   estradiol (VIVELLE-DOT) 0.05 MG/24HR patch Place 1 patch onto the skin 2 (two) times a week.     fluticasone  (FLONASE ) 50 MCG/ACT nasal spray SPRAY 2 SPRAYS INTO EACH NOSTRIL EVERY DAY 48 mL 2   ipratropium (ATROVENT ) 0.03 % nasal spray Place 2 sprays into both nostrils 3 (three) times daily as needed. 30 mL 12   levothyroxine  (SYNTHROID ) 100 MCG tablet Take 1 tablet (100 mcg total) by mouth daily. 90 tablet  3   LORazepam  (ATIVAN ) 1 MG tablet Take 1 tablet (1 mg total) by mouth 2 (two) times daily as needed for anxiety. 30 min prior to flying 10 tablet 0   omeprazole  (PRILOSEC) 40 MG capsule TAKE 30 MINUTES PRIOR TO MORNING MEAL ON EMPTY STOMACH. 90 capsule 1   RESTASIS 0.05 % ophthalmic emulsion      Cholecalciferol (VITAMIN D3) 25 MCG (1000 UT) CHEW Chew 1 each by mouth daily. (Patient not taking: Reported on 09/11/2023)     dicyclomine  (BENTYL ) 20 MG tablet Take 1 tablet (20 mg total) by mouth 2 (two) times daily.  (Patient not taking: Reported on 09/11/2023) 20 tablet 0   No current facility-administered medications for this visit.    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  Constitutional:      Appearance: Normal appearance. She is obese.  HENT:     Head: Normocephalic.     Right Ear: Tympanic membrane normal.     Left Ear: Tympanic membrane normal.     Nose: Nose normal.     Mouth/Throat:     Mouth: Mucous membranes are moist.  Eyes:     Pupils: Pupils are equal, round, and reactive to light.  Cardiovascular:     Rate and Rhythm: Normal rate and regular rhythm.  Pulmonary:     Effort: Pulmonary effort is normal.     Breath sounds: Normal breath sounds.  Musculoskeletal:        General: Normal range of motion.     Cervical back: Normal range of motion.  Lymphadenopathy:     Cervical: No cervical adenopathy.  Skin:    General: Skin is warm and dry.  Neurological:     Mental Status: She is alert.  Psychiatric:        Mood and Affect: Mood normal.        Behavior: Behavior normal.     Assessment and Plan   Health Maintenance counseling: 1. Anticipatory guidance: Patient counseled regarding regular dental exams q6 months, eye exams,  avoiding smoking and second hand smoke, limiting alcohol to 1 beverage per day, no illicit drugs.   2. Risk factor reduction:  Advised patient of need for regular exercise and diet rich with fruits and vegetables to reduce risk of heart attack and stroke. Exercise- none.  Wt Readings from Last 3 Encounters:  06/12/24 200 lb 9.6 oz (91 kg)  07/05/23 205 lb 9.6 oz (93.3 kg)   11/23/22 192 lb (87.1 kg)   3. Immunizations/screenings/ancillary studies Immunization History  Administered Date(s) Administered   Hep A / Hep B 10/30/2019   Hepatitis B 11/20/2019   Influenza Split 12/20/2010, 12/20/2011   Influenza, Seasonal, Injecte, Preservative Fre 03/25/2007, 03/18/2008, 04/11/2009, 02/29/2024   Influenza,inj,Quad PF,6+ Mos 02/16/2017, 02/21/2018, 03/11/2019, 02/18/2021   Influenza,inj,quad, With Preservative 02/21/2015   Influenza-Unspecified 03/11/2022   PFIZER(Purple Top)SARS-COV-2 Vaccination 07/17/2019, 08/08/2019, 05/28/2020   PNEUMOCOCCAL CONJUGATE-20 06/12/2024   Td 10/30/2019   Tdap 12/23/2008   Varicella 04/18/2009, 06/20/2009   Zoster Recombinant(Shingrix) 09/23/2021, 01/06/2022  Health Maintenance Due  Topic Date Due   HIV Screening  Never done   Colonoscopy  07/28/2024    4. Cervical cancer screening- hysterectomy 5. Breast cancer screening-  mammogram  - up to date  6. Colon cancer screening - done at age 67, needs q5y, ordering today. 7. Skin cancer screening- advised regular sunscreen use. Denies worrisome, changing, or new skin lesions.  8. Birth control/STD check- postmenopausal/STD testing declined 9. Osteoporosis screening- N/A 10. Alcohol screening: rare 11. Smoking associated screening (lung cancer screening, AAA screen 65-75, UA)-   non-smoker 12. Exercise - walking Assessment & Plan Annual physical examination and preventive care Routine annual physical examination conducted. Labs ordered to check thyroid  levels and complete blood count, including white blood cell count and differential. - Ordered full CPE lab panel including thyroid  levels and complete blood count. - Discussed healthy diet, exercise, and avoiding processed foods.  Postablative hypothyroidism Managed with levothyroxine  100 micrograms daily. She reports adherence to medication regimen. - Ordered thyroid  level test. - Will refill levothyroxine  prescription  after reviewing labs. - F/U in 1 year  Obesity - Wt. Loss strategies reviewed including portion control, less carbs including sweets, eating most of calories earlier in day, drinking 64oz water qd, and establishing daily exercise routine.   Colorectal cancer screening  Colorectal cancer screening needed for surveillance r/t history of colonic polyps. Previous colonoscopy performed in 2021, advised to have f/u q5y. - Referred for follow-up colonoscopy.  Need for pneumococcal vaccine - Administered Prevnar 20 vaccine today.  Recommended follow up:  Return for any future concerns, Complete physical w/fasting labs. No future appointments.   Lucius Krabbe, NP   "

## 2024-06-13 LAB — COMPREHENSIVE METABOLIC PANEL WITH GFR
AG Ratio: 1.5 (calc) (ref 1.0–2.5)
ALT: 41 U/L — ABNORMAL HIGH (ref 6–29)
AST: 35 U/L (ref 10–35)
Albumin: 4.4 g/dL (ref 3.6–5.1)
Alkaline phosphatase (APISO): 132 U/L (ref 37–153)
BUN: 20 mg/dL (ref 7–25)
CO2: 24 mmol/L (ref 20–32)
Calcium: 9.8 mg/dL (ref 8.6–10.4)
Chloride: 106 mmol/L (ref 98–110)
Creat: 0.75 mg/dL (ref 0.50–1.03)
Globulin: 2.9 g/dL (ref 1.9–3.7)
Glucose, Bld: 97 mg/dL (ref 65–99)
Potassium: 4.1 mmol/L (ref 3.5–5.3)
Sodium: 138 mmol/L (ref 135–146)
Total Bilirubin: 0.4 mg/dL (ref 0.2–1.2)
Total Protein: 7.3 g/dL (ref 6.1–8.1)
eGFR: 92 mL/min/{1.73_m2}

## 2024-06-13 LAB — CBC WITH DIFFERENTIAL/PLATELET
Absolute Lymphocytes: 2274 {cells}/uL (ref 850–3900)
Absolute Monocytes: 534 {cells}/uL (ref 200–950)
Basophils Absolute: 29 {cells}/uL (ref 0–200)
Basophils Relative: 0.5 %
Eosinophils Absolute: 110 {cells}/uL (ref 15–500)
Eosinophils Relative: 1.9 %
HCT: 41.1 % (ref 35.9–46.0)
Hemoglobin: 13.7 g/dL (ref 11.7–15.5)
MCH: 30.6 pg (ref 27.0–33.0)
MCHC: 33.3 g/dL (ref 31.6–35.4)
MCV: 91.9 fL (ref 81.4–101.7)
MPV: 10.3 fL (ref 7.5–12.5)
Monocytes Relative: 9.2 %
Neutro Abs: 2854 {cells}/uL (ref 1500–7800)
Neutrophils Relative %: 49.2 %
Platelets: 240 10*3/uL (ref 140–400)
RBC: 4.47 Million/uL (ref 3.80–5.10)
RDW: 13.8 % (ref 11.0–15.0)
Total Lymphocyte: 39.2 %
WBC: 5.8 10*3/uL (ref 3.8–10.8)

## 2024-06-13 LAB — HIV ANTIBODY (ROUTINE TESTING W REFLEX)
HIV 1&2 Ab, 4th Generation: NONREACTIVE
HIV FINAL INTERPRETATION: NEGATIVE

## 2024-06-13 LAB — HEMOGLOBIN A1C
Hgb A1c MFr Bld: 5.7 % — ABNORMAL HIGH
Mean Plasma Glucose: 117 mg/dL
eAG (mmol/L): 6.5 mmol/L

## 2024-06-13 LAB — HEPATITIS C ANTIBODY: Hepatitis C Ab: NONREACTIVE

## 2024-06-13 LAB — LIPID PANEL
Cholesterol: 197 mg/dL
HDL: 56 mg/dL
LDL Cholesterol (Calc): 111 mg/dL — ABNORMAL HIGH
Non-HDL Cholesterol (Calc): 141 mg/dL — ABNORMAL HIGH
Total CHOL/HDL Ratio: 3.5 (calc)
Triglycerides: 189 mg/dL — ABNORMAL HIGH

## 2024-06-13 LAB — TSH: TSH: 0.18 m[IU]/L — ABNORMAL LOW (ref 0.40–4.50)

## 2024-06-18 ENCOUNTER — Ambulatory Visit: Payer: Self-pay | Admitting: Family

## 2024-06-18 DIAGNOSIS — E890 Postprocedural hypothyroidism: Secondary | ICD-10-CM

## 2024-06-18 MED ORDER — LEVOTHYROXINE SODIUM 100 MCG PO TABS: 100.0000 ug | ORAL_TABLET | Freq: Every day | ORAL | 3 refills | Status: AC

## 2024-06-19 ENCOUNTER — Ambulatory Visit

## 2024-06-19 DIAGNOSIS — J302 Other seasonal allergic rhinitis: Secondary | ICD-10-CM | POA: Diagnosis not present

## 2024-06-25 ENCOUNTER — Ambulatory Visit

## 2024-06-25 DIAGNOSIS — J302 Other seasonal allergic rhinitis: Secondary | ICD-10-CM
